# Patient Record
Sex: Male | Born: 1965 | Race: Black or African American | Hispanic: No | Marital: Single | Smoking: Never smoker
Health system: Southern US, Community
[De-identification: ages and names within clinical notes are randomized; demographics above are authoritative.]

## PROBLEM LIST (undated history)

## (undated) DIAGNOSIS — E119 Type 2 diabetes mellitus without complications: Secondary | ICD-10-CM

## (undated) DIAGNOSIS — I10 Essential (primary) hypertension: Secondary | ICD-10-CM

## (undated) HISTORY — DX: Type 2 diabetes mellitus without complications: E11.9

## (undated) HISTORY — PX: HERNIA REPAIR: SHX51

## (undated) HISTORY — DX: Essential (primary) hypertension: I10

---

## 2006-12-02 ENCOUNTER — Emergency Department (HOSPITAL_COMMUNITY): Admission: EM | Admit: 2006-12-02 | Discharge: 2006-12-02 | Payer: Self-pay | Admitting: Emergency Medicine

## 2017-02-26 ENCOUNTER — Emergency Department (HOSPITAL_COMMUNITY)
Admission: EM | Admit: 2017-02-26 | Discharge: 2017-02-26 | Disposition: A | Discharge status: Home / Self Care | Payer: Self-pay | Attending: Emergency Medicine | Admitting: Emergency Medicine

## 2017-02-26 ENCOUNTER — Encounter (HOSPITAL_COMMUNITY): Payer: Self-pay | Admitting: Emergency Medicine

## 2017-02-26 DIAGNOSIS — I10 Essential (primary) hypertension: Secondary | ICD-10-CM | POA: Insufficient documentation

## 2017-02-26 DIAGNOSIS — R739 Hyperglycemia, unspecified: Secondary | ICD-10-CM | POA: Insufficient documentation

## 2017-02-26 LAB — I-STAT CHEM 8, ED
BUN: 15 mg/dL (ref 6–20)
CALCIUM ION: 1.11 mmol/L — AB (ref 1.15–1.40)
Chloride: 107 mmol/L (ref 101–111)
Creatinine, Ser: 1 mg/dL (ref 0.61–1.24)
Glucose, Bld: 270 mg/dL — ABNORMAL HIGH (ref 65–99)
HCT: 41 % (ref 39.0–52.0)
Hemoglobin: 13.9 g/dL (ref 13.0–17.0)
Potassium: 3.5 mmol/L (ref 3.5–5.1)
SODIUM: 140 mmol/L (ref 135–145)
TCO2: 23 mmol/L (ref 22–32)

## 2017-02-26 LAB — COMPREHENSIVE METABOLIC PANEL
ALT: 28 U/L (ref 17–63)
AST: 20 U/L (ref 15–41)
Albumin: 3.5 g/dL (ref 3.5–5.0)
Alkaline Phosphatase: 96 U/L (ref 38–126)
Anion gap: 7 (ref 5–15)
BILIRUBIN TOTAL: 0.4 mg/dL (ref 0.3–1.2)
BUN: 22 mg/dL — AB (ref 6–20)
CALCIUM: 8.8 mg/dL — AB (ref 8.9–10.3)
CO2: 25 mmol/L (ref 22–32)
CREATININE: 1.46 mg/dL — AB (ref 0.61–1.24)
Chloride: 96 mmol/L — ABNORMAL LOW (ref 101–111)
GFR, EST NON AFRICAN AMERICAN: 54 mL/min — AB (ref >60)
Glucose, Bld: 774 mg/dL (ref 65–99)
Potassium: 4.3 mmol/L (ref 3.5–5.1)
Sodium: 128 mmol/L — ABNORMAL LOW (ref 135–145)
TOTAL PROTEIN: 6.6 g/dL (ref 6.5–8.1)

## 2017-02-26 LAB — URINALYSIS, ROUTINE W REFLEX MICROSCOPIC
Bacteria, UA: NONE SEEN
Bilirubin Urine: NEGATIVE
HGB URINE DIPSTICK: NEGATIVE
KETONES UR: NEGATIVE mg/dL
LEUKOCYTES UA: NEGATIVE
Nitrite: NEGATIVE
PH: 5 (ref 5.0–8.0)
Protein, ur: NEGATIVE mg/dL
RBC / HPF: NONE SEEN RBC/hpf (ref 0–5)
SQUAMOUS EPITHELIAL / LPF: NONE SEEN
Specific Gravity, Urine: 1.028 (ref 1.005–1.030)

## 2017-02-26 LAB — CBC WITH DIFFERENTIAL/PLATELET
BASOS ABS: 0 10*3/uL (ref 0.0–0.1)
BASOS PCT: 0 %
EOS ABS: 0.1 10*3/uL (ref 0.0–0.7)
EOS PCT: 2 %
HCT: 39.4 % (ref 39.0–52.0)
HEMOGLOBIN: 13.2 g/dL (ref 13.0–17.0)
Lymphocytes Relative: 22 %
Lymphs Abs: 1.4 10*3/uL (ref 0.7–4.0)
MCH: 28.6 pg (ref 26.0–34.0)
MCHC: 33.5 g/dL (ref 30.0–36.0)
MCV: 85.5 fL (ref 78.0–100.0)
Monocytes Absolute: 0.3 10*3/uL (ref 0.1–1.0)
Monocytes Relative: 4 %
NEUTROS PCT: 72 %
Neutro Abs: 4.5 10*3/uL (ref 1.7–7.7)
PLATELETS: 153 10*3/uL (ref 150–400)
RBC: 4.61 MIL/uL (ref 4.22–5.81)
RDW: 13.2 % (ref 11.5–15.5)
WBC: 6.3 10*3/uL (ref 4.0–10.5)

## 2017-02-26 LAB — CBG MONITORING, ED
GLUCOSE-CAPILLARY: 446 mg/dL — AB (ref 65–99)
GLUCOSE-CAPILLARY: 265 mg/dL — AB (ref 65–99)
Glucose-Capillary: >600 mg/dL (ref 65–99)
Glucose-Capillary: 493 mg/dL — ABNORMAL HIGH (ref 65–99)
Glucose-Capillary: 363 mg/dL — ABNORMAL HIGH (ref 65–99)

## 2017-02-26 LAB — BLOOD GAS, VENOUS
ACID-BASE DEFICIT: 2.2 mmol/L — AB (ref 0.0–2.0)
Bicarbonate: 22.3 mmol/L (ref 20.0–28.0)
FIO2: 0.21
O2 Content: 21 L/min
O2 SAT: 96.7 %
PCO2 VEN: 42.5 mmHg — AB (ref 44.0–60.0)
pH, Ven: 7.346 (ref 7.250–7.430)
pO2, Ven: 94.1 mmHg — ABNORMAL HIGH (ref 32.0–45.0)

## 2017-02-26 MED ORDER — SODIUM CHLORIDE 0.9 % IV BOLUS (SEPSIS)
2000.0000 mL | Freq: Once | INTRAVENOUS | Status: AC
  Administered 2017-02-26: 2000 mL via INTRAVENOUS

## 2017-02-26 MED ORDER — SODIUM CHLORIDE 0.9 % IV BOLUS (SEPSIS)
1000.0000 mL | Freq: Once | INTRAVENOUS | Status: AC
  Administered 2017-02-26: 1000 mL via INTRAVENOUS

## 2017-02-26 MED ORDER — DEXTROSE-NACL 5-0.45 % IV SOLN: INTRAVENOUS | Status: DC

## 2017-02-26 MED ORDER — SODIUM CHLORIDE 0.9 % IV SOLN: INTRAVENOUS | Status: DC

## 2017-02-26 MED ORDER — METFORMIN HCL 500 MG PO TABS: 500.0000 mg | ORAL_TABLET | Freq: Two times a day (BID) | ORAL | 0 refills | Status: DC

## 2017-02-26 MED ORDER — HYDROCHLOROTHIAZIDE 25 MG PO TABS: 25.0000 mg | ORAL_TABLET | Freq: Every day | ORAL | 0 refills | Status: DC

## 2017-02-26 MED ORDER — SODIUM CHLORIDE 0.9 % IV SOLN
INTRAVENOUS | Status: DC
  Administered 2017-02-26: 5.4 [IU]/h via INTRAVENOUS
  Filled 2017-02-26: qty 1

## 2017-02-26 NOTE — Discharge Instructions (Signed)
[facility] any of the numbers listed today or tomorrow to get a primary care physician to follow you for diabetes and high blood pressure.  Start taking the medications prescribed today.  You should be seen by her primary care physician in a week.  Ask your new doctor to recheck your blood pressure.

## 2017-02-26 NOTE — ED Triage Notes (Signed)
Pt c/o blurry vision and frequent urination x 1 month. CBG-HI >600.

## 2017-02-26 NOTE — ED Notes (Signed)
CRITICAL VALUE ALERT  Critical Value:  Glucose 774  Date & Time Notied:  02/26/17 @ 0921  Provider Notified: Dr. Gordy SaversJacubwitz  Orders Received/Actions taken: EDP made aware

## 2017-02-26 NOTE — Care Management Note (Signed)
Case Management Note  Patient Details  Name: Tim Higgins MRN: [id number] Date of Birth: 1966-03-17  Subjective/Objective:                 Patients glucose greater than 700, no pertinent past medical history.  Confirmed with patient that he does not have a PCP.  States he does work and his insurance is to start in January.   Gave patient a list of clinics locally that are accepting new patients and [facility].  Patient appeared interested in info given and asked appropriate questions concerning importance of followup with PCP with his high blood sugars.   Action/Plan:   Expected Discharge Date:                  Expected Discharge Plan:     In-House Referral:     Discharge planning Services     Post Acute Care Choice:    Choice offered to:     DME Arranged:    DME Agency:     HH Arranged:    HH Agency:     Status of Service:     If discussed at [employer] Length of Stay Meetings, dates discussed:    Additional Comments:  Vangie BickerBrown, Clearnce Leja Jane, RN 02/26/2017, 10:18 AM

## 2017-02-26 NOTE — ED Provider Notes (Addendum)
[facility]ENCY DEPARTMENT Provider Note   CSN: [id number] Arrival date & time: 02/26/17  [id number]     History   Chief Complaint Chief Complaint  Patient presents with  . Hyperglycemia    HPI Zannie V Alwyn RenHopper is a 51 y.o. male.  Complains of frequent urination, constant thirst and blurred vision for 1 month.  Symptoms unchanged.  He discussed these issues with people at church who told him to come to the hospital to get checked out.  He denies any chest pain denies fever.  No other associated symptoms.  No treatment prior to coming here.  Nothing makes symptoms better or worse  HPI  History reviewed. No pertinent past medical history.  There are no active problems to display for this patient.  Past medical history negative Past Surgical History:  Procedure Laterality Date  . HERNIA REPAIR         Home Medications    Prior to Admission medications   Not on File    Family History No family history on file.  Social History Social History   Tobacco Use  . Smoking status: Never Smoker  . Smokeless tobacco: Never Used  Substance Use Topics  . Alcohol use: No    Frequency: Never  . Drug use: No     Allergies   Patient has no known allergies.   Review of Systems Review of Systems  Constitutional: Negative.   HENT: Negative.   Eyes: Positive for visual disturbance.  Respiratory: Negative.   Cardiovascular: Negative.   Gastrointestinal: Negative.   Endocrine: Positive for polydipsia and polyuria.  Musculoskeletal: Negative.   Skin: Negative.   Neurological: Negative.   Psychiatric/Behavioral: Negative.   All other systems reviewed and are negative.    Physical Exam Updated Vital Signs BP (!) 186/123   Pulse 98   Temp (!) 97.5 F (36.4 C) (Oral)   Resp 18   SpO2 97%   Physical Exam  Constitutional: He is oriented to person, place, and time. He appears well-developed and well-nourished. No distress.  HENT:  Head: Normocephalic and  atraumatic.  Mucous membranes dry  Eyes: Conjunctivae are normal. Pupils are equal, round, and reactive to light.  Neck: Neck supple. No tracheal deviation present. No thyromegaly present.  Cardiovascular: Normal rate and regular rhythm.  No murmur heard. Pulmonary/Chest: Effort normal and breath sounds normal.  Abdominal: Soft. Bowel sounds are normal. He exhibits no distension. There is no tenderness.  Genitourinary: Penis normal.  Musculoskeletal: Normal range of motion. He exhibits no edema or tenderness.  Neurological: He is alert and oriented to person, place, and time. Coordination normal.  Skin: Skin is warm and dry. Capillary refill takes less than 2 seconds. No rash noted.  Psychiatric: He has a normal mood and affect.  Nursing note and vitals reviewed.    ED Treatments / Results  Labs (all labs ordered are listed, but only abnormal results are displayed) Labs Reviewed  CBG MONITORING, ED - Abnormal; Notable for the following components:      Result Value   Glucose-Capillary >600 (*)    All other components within normal limits    EKG  EKG Interpretation None       Radiology No results found.  Procedures Procedures (including critical care time)  Medications Ordered in ED Medications - No data to display Results for orders placed or performed during the hospital encounter of 02/26/17  Comprehensive metabolic panel  Result Value Ref Range   Sodium 128 (L) 135 - 145  mmol/L   Potassium 4.3 3.5 - 5.1 mmol/L   Chloride 96 (L) 101 - 111 mmol/L   CO2 25 22 - 32 mmol/L   Glucose, Bld 774 (HH) 65 - 99 mg/dL   BUN 22 (H) 6 - 20 mg/dL   Creatinine, Ser 1.61 (H) 0.61 - 1.24 mg/dL   Calcium 8.8 (L) 8.9 - 10.3 mg/dL   Total Protein 6.6 6.5 - 8.1 g/dL   Albumin 3.5 3.5 - 5.0 g/dL   AST 20 15 - 41 U/L   ALT 28 17 - 63 U/L   Alkaline Phosphatase 96 38 - 126 U/L   Total Bilirubin 0.4 0.3 - 1.2 mg/dL   GFR calc non Af Amer 54 (L) >60 mL/min   GFR calc Af Amer >60  >60 mL/min   Anion gap 7 5 - 15  CBC with Differential/Platelet  Result Value Ref Range   WBC 6.3 4.0 - 10.5 K/uL   RBC 4.61 4.22 - 5.81 MIL/uL   Hemoglobin 13.2 13.0 - 17.0 g/dL   HCT 09.6 04.5 - 40.9 %   MCV 85.5 78.0 - 100.0 fL   MCH 28.6 26.0 - 34.0 pg   MCHC 33.5 30.0 - 36.0 g/dL   RDW 81.1 91.4 - 78.2 %   Platelets 153 150 - 400 K/uL   Neutrophils Relative % 72 %   Neutro Abs 4.5 1.7 - 7.7 K/uL   Lymphocytes Relative 22 %   Lymphs Abs 1.4 0.7 - 4.0 K/uL   Monocytes Relative 4 %   Monocytes Absolute 0.3 0.1 - 1.0 K/uL   Eosinophils Relative 2 %   Eosinophils Absolute 0.1 0.0 - 0.7 K/uL   Basophils Relative 0 %   Basophils Absolute 0.0 0.0 - 0.1 K/uL  Blood gas, venous  Result Value Ref Range   FIO2 0.21    O2 Content 21.0 L/min   Delivery systems ROOM AIR    pH, Ven 7.346 7.250 - 7.430   pCO2, Ven 42.5 (L) 44.0 - 60.0 mmHg   pO2, Ven 94.1 (H) 32.0 - 45.0 mmHg   Bicarbonate 22.3 20.0 - 28.0 mmol/L   Acid-base deficit 2.2 (H) 0.0 - 2.0 mmol/L   O2 Saturation 96.7 %   Collection site NOT INDICATED    Drawn by DRAWN BY RN    Sample type VENOUS   Urinalysis, Routine w reflex microscopic  Result Value Ref Range   Color, Urine COLORLESS (A) YELLOW   APPearance CLEAR CLEAR   Specific Gravity, Urine 1.028 1.005 - 1.030   pH 5.0 5.0 - 8.0   Glucose, UA >=500 (A) NEGATIVE mg/dL   Hgb urine dipstick NEGATIVE NEGATIVE   Bilirubin Urine NEGATIVE NEGATIVE   Ketones, ur NEGATIVE NEGATIVE mg/dL   Protein, ur NEGATIVE NEGATIVE mg/dL   Nitrite NEGATIVE NEGATIVE   Leukocytes, UA NEGATIVE NEGATIVE   RBC / HPF NONE SEEN 0 - 5 RBC/hpf   WBC, UA 0-5 0 - 5 WBC/hpf   Bacteria, UA NONE SEEN NONE SEEN   Squamous Epithelial / LPF NONE SEEN NONE SEEN  CBG monitoring, ED  Result Value Ref Range   Glucose-Capillary >600 (HH) 65 - 99 mg/dL  CBG monitoring, ED  Result Value Ref Range   Glucose-Capillary >600 (HH) 65 - 99 mg/dL  CBG monitoring, ED  Result Value Ref Range    Glucose-Capillary 493 (H) 65 - 99 mg/dL   Comment 1 Document in Chart   CBG monitoring, ED  Result Value Ref Range   Glucose-Capillary 446 (  H) 65 - 99 mg/dL  CBG monitoring, ED  Result Value Ref Range   Glucose-Capillary 363 (H) 65 - 99 mg/dL  CBG monitoring, ED  Result Value Ref Range   Glucose-Capillary 265 (H) 65 - 99 mg/dL  I-stat chem 8, ed  Result Value Ref Range   Sodium 140 135 - 145 mmol/L   Potassium 3.5 3.5 - 5.1 mmol/L   Chloride 107 101 - 111 mmol/L   BUN 15 6 - 20 mg/dL   Creatinine, Ser 4.091.00 0.61 - 1.24 mg/dL   Glucose, Bld 811270 (H) 65 - 99 mg/dL   Calcium, Ion 9.141.11 (L) 1.15 - 1.40 mmol/L   TCO2 23 22 - 32 mmol/L   Hemoglobin 13.9 13.0 - 17.0 g/dL   HCT 78.241.0 95.639.0 - 21.352.0 %   No results found.  Initial Impression / Assessment and Plan / ED Course  I have reviewed the triage vital signs and the nursing notes.  Pertinent labs & imaging results that were available during my care of the patient were reviewed by me and considered in my medical decision making (see chart for details).     3:10 PM patient feels much improved after treatment with intravenous hydration, intravenous insulin drip.  Is ready to go home. I suspect the patient has long-standing unrecognized type 2 diabetes hypertension Plan prescriptions metformin, HCTZ and referral primary care, blood pressure recheck 1 week Final Clinical Impressions(s) / ED Diagnoses   #1 hyperglycemia #2 elevated blood pressure Final diagnoses:  None    ED Discharge Orders    None       Doug SouJacubowitz, Chasten Blaze, MD 02/26/17 1516    Doug SouJacubowitz, Eris Hannan, MD 02/26/17 1521

## 2019-05-16 ENCOUNTER — Emergency Department (HOSPITAL_COMMUNITY): Payer: Self-pay

## 2019-05-16 ENCOUNTER — Other Ambulatory Visit: Payer: Self-pay

## 2019-05-16 ENCOUNTER — Emergency Department (HOSPITAL_COMMUNITY)
Admission: EM | Admit: 2019-05-16 | Discharge: 2019-05-17 | Disposition: A | Discharge status: Home / Self Care | Payer: Self-pay | Attending: Emergency Medicine | Admitting: Emergency Medicine

## 2019-05-16 ENCOUNTER — Encounter (HOSPITAL_COMMUNITY): Payer: Self-pay | Admitting: *Deleted

## 2019-05-16 DIAGNOSIS — I10 Essential (primary) hypertension: Secondary | ICD-10-CM

## 2019-05-16 DIAGNOSIS — M25511 Pain in right shoulder: Secondary | ICD-10-CM

## 2019-05-16 DIAGNOSIS — Z79899 Other long term (current) drug therapy: Secondary | ICD-10-CM | POA: Insufficient documentation

## 2019-05-16 DIAGNOSIS — R030 Elevated blood-pressure reading, without diagnosis of hypertension: Secondary | ICD-10-CM | POA: Insufficient documentation

## 2019-05-16 DIAGNOSIS — R739 Hyperglycemia, unspecified: Secondary | ICD-10-CM

## 2019-05-16 DIAGNOSIS — Z7984 Long term (current) use of oral hypoglycemic drugs: Secondary | ICD-10-CM | POA: Insufficient documentation

## 2019-05-16 LAB — CBG MONITORING, ED: Glucose-Capillary: 500 mg/dL — ABNORMAL HIGH (ref 70–99)

## 2019-05-16 NOTE — ED Notes (Signed)
Pt states he took two pain relievers from OTC at [facility] around 1900 tonight without relief.

## 2019-05-16 NOTE — ED Notes (Signed)
Pt to XRAY

## 2019-05-16 NOTE — ED Triage Notes (Signed)
Pt c/o right shoulder pain for the past two weeks, denies any  Injury,

## 2019-05-16 NOTE — ED Provider Notes (Signed)
[facility] EMERGENCY DEPARTMENT Provider Note   CSN: [id number] Arrival date & time: 05/16/19  2213   History Chief Complaint  Patient presents with  . Shoulder Pain    Tim Higgins is a 54 y.o. male.  The history is provided by the patient.  Shoulder Pain He complains of pain in the posterior aspect of his right shoulder over the last 2 weeks.  Pain is worse with movement and if he pushes on that area.  He rates his pain at 10/10.  He took an unknown over-the-counter pain medicine without relief.  He denies any trauma or unusual activity.  He denies prior shoulder problems.  Also, he had been given a prescription for medicine for his blood sugar and blood pressure the last time he was in the emergency department about 2 years ago and he took the medication for 1 month but has not been able to get it refilled.  He has not checked his blood pressure or blood sugar since then.   History reviewed. No pertinent past medical history.  There are no problems to display for this patient.   Past Surgical History:  Procedure Laterality Date  . HERNIA REPAIR         No family history on file.  Social History   Tobacco Use  . Smoking status: Never Smoker  . Smokeless tobacco: Never Used  Substance Use Topics  . Alcohol use: No  . Drug use: No    Home Medications Prior to Admission medications   Medication Sig Start Date End Date Taking? Authorizing Provider  hydrochlorothiazide (HYDRODIURIL) 25 MG tablet Take 1 tablet (25 mg total) daily by mouth. 02/26/17   Doug Sou, MD  metFORMIN (GLUCOPHAGE) 500 MG tablet Take 1 tablet (500 mg total) 2 (two) times daily with a meal by mouth. 02/26/17   Doug Sou, MD    Allergies    Patient has no known allergies.  Review of Systems   Review of Systems  All other systems reviewed and are negative.   Physical Exam Updated Vital Signs BP (!) 198/113 (BP Location: Left Arm)   Pulse 88   Temp (!) 97.4 F (36.3 C)  (Oral)   Resp 16   Ht 6\' 1"  (1.854 m)   Wt 136.1 kg   SpO2 100%   BMI 39.58 kg/m   Physical Exam Vitals and nursing note reviewed.   54 year old male, resting comfortably and in no acute distress. Vital signs are significant for elevated blood pressure. Oxygen saturation is 100%, which is normal. Head is normocephalic and atraumatic. PERRLA, EOMI. Oropharynx is clear. Neck is nontender and supple without adenopathy or JVD. Back is nontender and there is no CVA tenderness. Lungs are clear without rales, wheezes, or rhonchi. Chest is nontender. Heart has regular rate and rhythm without murmur. Abdomen is soft, flat, nontender without masses or hepatosplenomegaly and peristalsis is normoactive. Extremities:No swelling or deformity. There is tenderness to palpation of the right anterior deltoid groove, no other area of tenderness identified.  There is moderate restricted range of motion of the right shoulder in all 3 planes.  Rotator cuff impingement signs are present.Skin is warm and dry without rash. Neurologic: Mental status is normal, cranial nerves are intact, there are no motor or sensory deficits.  ED Results / Procedures / Treatments   Labs (all labs ordered are listed, but only abnormal results are displayed) Labs Reviewed  BASIC METABOLIC PANEL - Abnormal; Notable for the following components:  Result Value   Sodium 131 (*)    Chloride 97 (*)    Glucose, Bld 519 (*)    All other components within normal limits  CBG MONITORING, ED - Abnormal; Notable for the following components:   Glucose-Capillary 500 (*)    All other components within normal limits  CBG MONITORING, ED - Abnormal; Notable for the following components:   Glucose-Capillary 297 (*)    All other components within normal limits  CBC WITH DIFFERENTIAL/PLATELET   Radiology DG Shoulder Right  Result Date: 05/16/2019 CLINICAL DATA:  Right shoulder pain for 2 weeks. No known injury. EXAM: RIGHT SHOULDER -  2+ VIEW COMPARISON:  None. FINDINGS: There is no evidence of fracture or dislocation. Mild glenohumeral osteoarthritis with subchondral cystic change. Trace acromioclavicular osteoarthritis tiny inferiorly directed spurs. Soft tissues are unremarkable. IMPRESSION: Mild glenohumeral and acromioclavicular osteoarthritis. Electronically Signed   By: Keith Rake M.D.   On: 05/16/2019 23:11   Procedures Procedures   Medications Ordered in ED Medications  sodium chloride 0.9 % bolus 1,000 mL (0 mLs Intravenous Stopped 05/17/19 0135)  metFORMIN (GLUCOPHAGE) tablet 500 mg (500 mg Oral Given 05/17/19 0014)  insulin aspart (novoLOG) injection 10 Units (10 Units Intravenous Given 05/17/19 0015)  ketorolac (TORADOL) 30 MG/ML injection 30 mg (30 mg Intravenous Given 05/17/19 0135)    ED Course  I have reviewed the triage vital signs and the nursing notes.  Pertinent labs & imaging results that were available during my care of the patient were reviewed by me and considered in my medical decision making (see chart for details).  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  Shoulder x-rays show mild degenerative changes.  Markedly elevated blood pressure.  Old records are reviewed, and he was seen February 26, 2017 for elevated glucose and also noted to have significantly elevated blood pressure at that time.  Initial glucose was 774 with creatinine of 1.46, repeat had glucose down to 270 and creatinine down to 1.0.  Given the fact that he has gone over 2 years without medications, I feel it is important to recheck his glucose and creatinine.  Glucose is 500 but with normal renal function.  Glucose has come down to 297 with fluids and insulin.  His renal function is normal, it is reasonable to start NSAIDs and is given a dose of ketorolac and he is advised to take over-the-counter naproxen.  He is discharged with  prescriptions for Metformin and hydrochlorothiazide and he is given financial resources information to try to get established with a primary care office.  Given his degree of blood pressure elevation, I suspect he will need to be on additional medication.  Importance of not allowing his medication to run out was stressed.  Final Clinical Impression(s) / ED Diagnoses Final diagnoses:  Right shoulder pain, unspecified chronicity  Elevated blood pressure reading with diagnosis of hypertension  Hyperglycemia    Rx / DC Orders ED Discharge Orders         Ordered    hydrochlorothiazide (HYDRODIURIL) 25 MG tablet  Daily     05/17/19 0159    metFORMIN (GLUCOPHAGE) 500 MG tablet  2 times daily with meals     05/17/19 8127           Delora Fuel, MD 51/70/01 0206

## 2019-05-17 LAB — CBC WITH DIFFERENTIAL/PLATELET
Abs Immature Granulocytes: 0.02 10*3/uL (ref 0.00–0.07)
Basophils Absolute: 0 10*3/uL (ref 0.0–0.1)
Basophils Relative: 0 %
Eosinophils Absolute: 0.2 10*3/uL (ref 0.0–0.5)
Eosinophils Relative: 2 %
HCT: 41.5 % (ref 39.0–52.0)
Hemoglobin: 13.5 g/dL (ref 13.0–17.0)
Immature Granulocytes: 0 %
Lymphocytes Relative: 23 %
Lymphs Abs: 1.6 10*3/uL (ref 0.7–4.0)
MCH: 27.6 pg (ref 26.0–34.0)
MCHC: 32.5 g/dL (ref 30.0–36.0)
MCV: 84.9 fL (ref 80.0–100.0)
Monocytes Absolute: 0.5 10*3/uL (ref 0.1–1.0)
Monocytes Relative: 7 %
Neutro Abs: 4.5 10*3/uL (ref 1.7–7.7)
Neutrophils Relative %: 68 %
Platelets: 194 10*3/uL (ref 150–400)
RBC: 4.89 MIL/uL (ref 4.22–5.81)
RDW: 13 % (ref 11.5–15.5)
WBC: 6.8 10*3/uL (ref 4.0–10.5)
nRBC: 0 % (ref 0.0–0.2)

## 2019-05-17 LAB — BASIC METABOLIC PANEL
Anion gap: 10 (ref 5–15)
BUN: 19 mg/dL (ref 6–20)
CO2: 24 mmol/L (ref 22–32)
Calcium: 9 mg/dL (ref 8.9–10.3)
Chloride: 97 mmol/L — ABNORMAL LOW (ref 98–111)
Creatinine, Ser: 1.03 mg/dL (ref 0.61–1.24)
GFR calc Af Amer: >60 mL/min (ref >60)
GFR calc non Af Amer: >60 mL/min (ref >60)
Glucose, Bld: 519 mg/dL (ref 70–99)
Potassium: 4.2 mmol/L (ref 3.5–5.1)
Sodium: 131 mmol/L — ABNORMAL LOW (ref 135–145)

## 2019-05-17 LAB — CBG MONITORING, ED: Glucose-Capillary: 297 mg/dL — ABNORMAL HIGH (ref 70–99)

## 2019-05-17 MED ORDER — INSULIN ASPART 100 UNIT/ML IV SOLN
10.0000 [IU] | Freq: Once | INTRAVENOUS | Status: AC
  Administered 2019-05-17: 10 [IU] via INTRAVENOUS

## 2019-05-17 MED ORDER — HYDROCHLOROTHIAZIDE 25 MG PO TABS: 25.0000 mg | ORAL_TABLET | Freq: Every day | ORAL | 0 refills | Status: DC

## 2019-05-17 MED ORDER — KETOROLAC TROMETHAMINE 30 MG/ML IJ SOLN
30.0000 mg | Freq: Once | INTRAMUSCULAR | Status: AC
  Administered 2019-05-17: 02:00:00 30 mg via INTRAVENOUS
  Filled 2019-05-17: qty 1

## 2019-05-17 MED ORDER — SODIUM CHLORIDE 0.9 % IV BOLUS
1000.0000 mL | Freq: Once | INTRAVENOUS | Status: AC
  Administered 2019-05-17: 1000 mL via INTRAVENOUS

## 2019-05-17 MED ORDER — METFORMIN HCL 500 MG PO TABS
500.0000 mg | ORAL_TABLET | Freq: Once | ORAL | Status: AC
Start: 2019-05-17 — End: 2019-05-17
  Administered 2019-05-17: 500 mg via ORAL
  Filled 2019-05-17: qty 1

## 2019-05-17 MED ORDER — METFORMIN HCL 500 MG PO TABS: 500.0000 mg | ORAL_TABLET | Freq: Two times a day (BID) | ORAL | 0 refills | Status: DC

## 2019-05-17 NOTE — Discharge Instructions (Addendum)
Your shoulder pain seems to be a combination of rotator cuff injury and frozen shoulder.  Please follow-up with the orthopedic doctor who may be able to schedule some physical therapy to help your shoulder.  In the meantime, apply ice several times a day.  Try to put your shoulder through is full range of motion as he can.  Take 2 naproxen tablets at a time twice a day.  Your blood pressure was very high today.  You will need to continuously be on blood pressure medication.  You are again being given a 1 month supply today.  During that time, you will need to make arrangements to get a primary care physician who can prescribe your medication on an ongoing basis.  Do not allow yourself to run out of the medication.  If your blood pressure is not adequately controlled, it can lead to heart attacks, strokes, kidney failure.  Please monitor your blood pressure at home.  The top number should be no higher than 140, bottom number no higher than 90.  Your blood sugar is elevated indicating that you have diabetes.  You are being given a 1 month supply of metformin.  It is important that you continue to take this even when the current prescription bottle runs out.  Failure to treat diabetes adequately leads to heart attacks, strokes, kidney failure, blindness, amputations.

## 2019-05-17 NOTE — ED Notes (Signed)
Pt ambulated to restroom. 

## 2019-05-17 NOTE — ED Notes (Signed)
Date and time results received: 05/17/19 0028   Test: Glucose Critical Value: 519  Name of Provider Notified: Preston Fleeting, MD

## 2019-05-30 ENCOUNTER — Emergency Department (HOSPITAL_COMMUNITY): Admission: EM | Admit: 2019-05-30 | Discharge: 2019-05-30 | Discharge status: Absent without leave | Payer: Self-pay

## 2019-05-30 ENCOUNTER — Other Ambulatory Visit: Payer: Self-pay

## 2019-05-30 ENCOUNTER — Ambulatory Visit (INDEPENDENT_AMBULATORY_CARE_PROVIDER_SITE_OTHER): Payer: Self-pay | Admitting: Family Medicine

## 2019-05-30 ENCOUNTER — Encounter: Payer: Self-pay | Admitting: Family Medicine

## 2019-05-30 VITALS — BP 168/102 | HR 87 | Temp 99.0°F | Resp 20 | Ht 73.0 in | Wt 269.0 lb

## 2019-05-30 DIAGNOSIS — I152 Hypertension secondary to endocrine disorders: Secondary | ICD-10-CM | POA: Insufficient documentation

## 2019-05-30 DIAGNOSIS — I10 Essential (primary) hypertension: Secondary | ICD-10-CM

## 2019-05-30 DIAGNOSIS — E1165 Type 2 diabetes mellitus with hyperglycemia: Secondary | ICD-10-CM

## 2019-05-30 DIAGNOSIS — E1159 Type 2 diabetes mellitus with other circulatory complications: Secondary | ICD-10-CM

## 2019-05-30 DIAGNOSIS — E1169 Type 2 diabetes mellitus with other specified complication: Secondary | ICD-10-CM | POA: Insufficient documentation

## 2019-05-30 DIAGNOSIS — N521 Erectile dysfunction due to diseases classified elsewhere: Secondary | ICD-10-CM

## 2019-05-30 LAB — BAYER DCA HB A1C WAIVED: HB A1C (BAYER DCA - WAIVED): >14 % — ABNORMAL HIGH (ref <7.0)

## 2019-05-30 MED ORDER — LOSARTAN POTASSIUM 25 MG PO TABS: 25.0000 mg | ORAL_TABLET | Freq: Every day | ORAL | 3 refills | Status: DC

## 2019-05-30 MED ORDER — HYDROCHLOROTHIAZIDE 25 MG PO TABS: 25.0000 mg | ORAL_TABLET | Freq: Every day | ORAL | 5 refills | Status: DC

## 2019-05-30 MED ORDER — METFORMIN HCL 1000 MG PO TABS: 1000.0000 mg | ORAL_TABLET | Freq: Two times a day (BID) | ORAL | 3 refills | Status: DC

## 2019-05-30 NOTE — Patient Instructions (Signed)
DASH Eating Plan DASH stands for "Dietary Approaches to Stop Hypertension." The DASH eating plan is a healthy eating plan that has been shown to reduce high blood pressure (hypertension). Additional health benefits may include reducing the risk of type 2 diabetes mellitus, heart disease, and stroke. The DASH eating plan may also help with weight loss.  WHAT DO I NEED TO KNOW ABOUT THE DASH EATING PLAN? For the DASH eating plan, you will follow these general guidelines:  Choose foods with a percent daily value for sodium of less than 5% (as listed on the food label).  Use salt-free seasonings or herbs instead of table salt or sea salt.  Check with your health care provider or pharmacist before using salt substitutes.  Eat lower-sodium products, often labeled as "lower sodium" or "no salt added."  Eat fresh foods.  Eat more vegetables, fruits, and low-fat dairy products.  Choose whole grains. Look for the word "whole" as the first word in the ingredient list.  Choose fish and skinless chicken or turkey more often than red meat. Limit fish, poultry, and meat to 6 oz (170 g) each day.  Limit sweets, desserts, sugars, and sugary drinks.  Choose heart-healthy fats.  Limit cheese to 1 oz (28 g) per day.  Eat more home-cooked food and less restaurant, buffet, and fast food.  Limit fried foods.  Cook foods using methods other than frying.  Limit canned vegetables. If you do use them, rinse them well to decrease the sodium.  When eating at a restaurant, ask that your food be prepared with less salt, or no salt if possible.  WHAT FOODS CAN I EAT? Seek help from a dietitian for individual calorie needs.  Grains Whole grain or whole wheat bread. Brown rice. Whole grain or whole wheat pasta. Quinoa, bulgur, and whole grain cereals. Low-sodium cereals. Corn or whole wheat flour tortillas. Whole grain cornbread. Whole grain crackers. Low-sodium crackers.  Vegetables Fresh or frozen  vegetables (raw, steamed, roasted, or grilled). Low-sodium or reduced-sodium tomato and vegetable juices. Low-sodium or reduced-sodium tomato sauce and paste. Low-sodium or reduced-sodium canned vegetables.   Fruits All fresh, canned (in natural juice), or frozen fruits.  Meat and Other Protein Products Ground beef (85% or leaner), grass-fed beef, or beef trimmed of fat. Skinless chicken or turkey. Ground chicken or turkey. Pork trimmed of fat. All fish and seafood. Eggs. Dried beans, peas, or lentils. Unsalted nuts and seeds. Unsalted canned beans.  Dairy Low-fat dairy products, such as skim or 1% milk, 2% or reduced-fat cheeses, low-fat ricotta or cottage cheese, or plain low-fat yogurt. Low-sodium or reduced-sodium cheeses.  Fats and Oils Tub margarines without trans fats. Light or reduced-fat mayonnaise and salad dressings (reduced sodium). Avocado. Safflower, olive, or canola oils. Natural peanut or almond butter.  Other Unsalted popcorn and pretzels. The items listed above may not be a complete list of recommended foods or beverages. Contact your dietitian for more options.  WHAT FOODS ARE NOT RECOMMENDED?  Grains White bread. White pasta. White rice. Refined cornbread. Bagels and croissants. Crackers that contain trans fat.  Vegetables Creamed or fried vegetables. Vegetables in a cheese sauce. Regular canned vegetables. Regular canned tomato sauce and paste. Regular tomato and vegetable juices.  Fruits Dried fruits. Canned fruit in light or heavy syrup. Fruit juice.  Meat and Other Protein Products Fatty cuts of meat. Ribs, chicken wings, bacon, sausage, bologna, salami, chitterlings, fatback, hot dogs, bratwurst, and packaged luncheon meats. Salted nuts and seeds. Canned beans with salt.    Dairy Whole or 2% milk, cream, half-and-half, and cream cheese. Whole-fat or sweetened yogurt. Full-fat cheeses or blue cheese. Nondairy creamers and whipped toppings. Processed cheese,  cheese spreads, or cheese curds.  Condiments Onion and garlic salt, seasoned salt, table salt, and sea salt. Canned and packaged gravies. Worcestershire sauce. Tartar sauce. Barbecue sauce. Teriyaki sauce. Soy sauce, including reduced sodium. Steak sauce. Fish sauce. Oyster sauce. Cocktail sauce. Horseradish. Ketchup and mustard. Meat flavorings and tenderizers. Bouillon cubes. Hot sauce. Tabasco sauce. Marinades. Taco seasonings. Relishes.  Fats and Oils Butter, stick margarine, lard, shortening, ghee, and bacon fat. Coconut, palm kernel, or palm oils. Regular salad dressings.  Other Pickles and olives. Salted popcorn and pretzels.  The items listed above may not be a complete list of foods and beverages to avoid. Contact your dietitian for more information.  WHERE CAN I FIND MORE INFORMATION? National Heart, Lung, and Blood Institute: [employer]Prom[employer]it Document Released: 03/23/2011 Document Revised: 08/18/2013 Document Reviewed: 02/05/2013 [facility] Patient Information 2015 [place], [place]. This information is not intended to replace advice given to you by your health care provider. Make sure you discuss any questions you have with your health care provider.   I think that you would greatly benefit from seeing a nutritionist.  If you are interested, please call Dr Gerilyn Pilgrim at [phone] to schedule an appointment.  Continue to monitor your blood sugars as we discussed and record them. Bring the log to your next appointment.  Take your medications as directed.    Goal Blood glucose:    Fasting (before meals) = 80 to 130   Within 2 hours of eating = less than 180   Understanding your Hemoglobin A1c:     Diabetes Mellitus and Nutrition    I think that you would greatly benefit from seeing a nutritionist. If this is something you are interested in, please call Dr Gerilyn Pilgrim at [phone] to schedule an appointment.   When you have diabetes (diabetes  mellitus), it is very important to have healthy eating habits because your blood sugar (glucose) levels are greatly affected by what you eat and drink. Eating healthy foods in the appropriate amounts, at about the same times every day, can help you:  Control your blood glucose.  Lower your risk of heart disease.  Improve your blood pressure.  Reach or maintain a healthy weight.  Every person with diabetes is different, and each person has different needs for a meal plan. Your health care provider may recommend that you work with a diet and nutrition specialist (dietitian) to make a meal plan that is best for you. Your meal plan may vary depending on factors such as:  The calories you need.  The medicines you take.  Your weight.  Your blood glucose, blood pressure, and cholesterol levels.  Your activity level.  Other health conditions you have, such as heart or kidney disease.  How do carbohydrates affect me? Carbohydrates affect your blood glucose level more than any other type of food. Eating carbohydrates naturally increases the amount of glucose in your blood. Carbohydrate counting is a method for keeping track of how many carbohydrates you eat. Counting carbohydrates is important to keep your blood glucose at a healthy level, especially if you use insulin or take certain oral diabetes medicines. It is important to know how many carbohydrates you can safely have in each meal. This is different for every person. Your dietitian can help you calculate how many carbohydrates you should have at each meal and for snack. Foods  that contain carbohydrates include:  Bread, cereal, rice, pasta, and crackers.  Potatoes and corn.  Peas, beans, and lentils.  Milk and yogurt.  Fruit and juice.  Desserts, such as cakes, cookies, ice cream, and candy.  How does alcohol affect me? Alcohol can cause a sudden decrease in blood glucose (hypoglycemia), especially if you use insulin or take  certain oral diabetes medicines. Hypoglycemia can be a life-threatening condition. Symptoms of hypoglycemia (sleepiness, dizziness, and confusion) are similar to symptoms of having too much alcohol. If your health care provider says that alcohol is safe for you, follow these guidelines:  Limit alcohol intake to no more than 1 drink per day for nonpregnant women and 2 drinks per day for men. One drink equals 12 oz of beer, 5 oz of wine, or 1 oz of hard liquor.  Do not drink on an empty stomach.  Keep yourself hydrated with water, diet soda, or unsweetened iced tea.  Keep in mind that regular soda, juice, and other mixers may contain a lot of sugar and must be counted as carbohydrates.  What are tips for following this plan?  Reading food labels  Start by checking the serving size on the label. The amount of calories, carbohydrates, fats, and other nutrients listed on the label are based on one serving of the food. Many foods contain more than one serving per package.  Check the total grams (g) of carbohydrates in one serving. You can calculate the number of servings of carbohydrates in one serving by dividing the total carbohydrates by 15. For example, if a food has 30 g of total carbohydrates, it would be equal to 2 servings of carbohydrates.  Check the number of grams (g) of saturated and trans fats in one serving. Choose foods that have low or no amount of these fats.  Check the number of milligrams (mg) of sodium in one serving. Most people should limit total sodium intake to less than 2,300 mg per day.  Always check the nutrition information of foods labeled as "low-fat" or "nonfat". These foods may be higher in added sugar or refined carbohydrates and should be avoided.  Talk to your dietitian to identify your daily goals for nutrients listed on the label.  Shopping  Avoid buying canned, premade, or processed foods. These foods tend to be high in fat, sodium, and added sugar.   Shop around the outside edge of the grocery store. This includes fresh fruits and vegetables, bulk grains, fresh meats, and fresh dairy.  Cooking  Use low-heat cooking methods, such as baking, instead of high-heat cooking methods like deep frying.  Cook using healthy oils, such as olive, canola, or sunflower oil.  Avoid cooking with butter, cream, or high-fat meats.  Meal planning  Eat meals and snacks regularly, preferably at the same times every day. Avoid going long periods of time without eating.  Eat foods high in fiber, such as fresh fruits, vegetables, beans, and whole grains. Talk to your dietitian about how many servings of carbohydrates you can eat at each meal.  Eat 4-6 ounces of lean protein each day, such as lean meat, chicken, fish, eggs, or tofu. 1 ounce is equal to 1 ounce of meat, chicken, or fish, 1 egg, or 1/4 cup of tofu.  Eat some foods each day that contain healthy fats, such as avocado, nuts, seeds, and fish.  Lifestyle   Check your blood glucose regularly.  Exercise at least 30 minutes 5 or more days each week, or as  told by your health care provider.  Take medicines as told by your health care provider.  Do not use any products that contain nicotine or tobacco, such as cigarettes and e-cigarettes. If you need help quitting, ask your health care provider.  Work with a Veterinary surgeon or diabetes educator to identify strategies to manage stress and any emotional and social challenges.   What are some questions to ask my health care provider?  Do I need to meet with a diabetes educator?  Do I need to meet with a dietitian?  What number can I call if I have questions?  When are the best times to check my blood glucose?   Where to find more information:  American Diabetes Association: diabetes.org/food-and-fitness/food  Academy of Nutrition and Dietetics: https://www.vargas.com/  [employer] of Diabetes  and Digestive and Kidney Diseases (NIH): FindJewelers.cz   Summary  A healthy meal plan will help you control your blood glucose and maintain a healthy lifestyle.  Working with a diet and nutrition specialist (dietitian) can help you make a meal plan that is best for you.  Keep in mind that carbohydrates and alcohol have immediate effects on your blood glucose levels. It is important to count carbohydrates and to use alcohol carefully. This information is not intended to replace advice given to you by your health care provider. Make sure you discuss any questions you have with your health care provider. Document Released: 12/29/2004 Document Revised: 05/08/2016 Document Reviewed: 05/08/2016 [employer]vier Interactive Patient Education  [employer].

## 2019-05-30 NOTE — Progress Notes (Signed)
Subjective:  Patient ID: Tim Higgins, male    DOB: 04/06/1966, 54 y.o.   MRN: [id number]  Patient Care Team: Baruch Gouty, FNP as PCP - General (Family Medicine)   Chief Complaint:  Establish Care (New)   HPI: Tim INTRIAGO is a 54 y.o. male presenting on 05/30/2019 for Establish Care (New)  Pt presents today to establish care with new PCP. Pt states he has not been seen by a PCP in the past. Pt states he usually goes to the ED for treatment when needed. EHR reviewed. Pt was seen in the ED on 02/26/2017 for polyuria, polydipsia, and and blurred vision. Pt had a blood sugar over 700 at this time. Pt was given IV fluids and IV insulin and discharged home on metformin and HCTZ. Pt was to follow up with PCP in one week after discharge from ED. Pt did not do this. Pt presented to the ED again on 05/16/2019 for shoulder pain. Pt had a blood sugar greater than 500 at this time. Pt told the ED physician he had not followed up with PCP after discharged from the ED in 2018. Pt states he only took the metformin and HCTZ until he ran out and never followed up for continued care. Pt was placed back on HCTZ and metformin 500 mg twice daily on last ED visit.  Pt reports he does not have insurance and finds it hard to see a provider and get medications. He states he has been taking his medications as prescribed since 05/16/2019. He denies polyuria, polydipsia, or polyphagia. No visual changes. Pt does report erectile dysfunction over the last 6 months. States he is able to get an erection and ejaculate but is unable to sustain an erection for long periods of time. No hematuria, penile pain, penile swelling, or discharge. No other reported symptoms. Pt does not watch his diet or exercise on a regular basis.   Relevant past medical, surgical, family, and social history reviewed and updated as indicated.  Allergies and medications reviewed and updated. Date reviewed: Chart in Epic.   Past Medical History:    Diagnosis Date  . Diabetes mellitus without complication ([place])   . Hypertension     Past Surgical History:  Procedure Laterality Date  . HERNIA REPAIR      Social History   Socioeconomic History  . Marital status: Single    Spouse name: Not on file  . Number of children: 6  . Years of education: Not on file  . Highest education level: Not on file  Occupational History  . Not on file  Tobacco Use  . Smoking status: Never Smoker  . Smokeless tobacco: Never Used  Substance and Sexual Activity  . Alcohol use: No  . Drug use: No  . Sexual activity: Not on file  Other Topics Concern  . Not on file  Social History Narrative  . Not on file   Social Determinants of Health   Financial Resource Strain:   . Difficulty of Paying Living Expenses: Not on file  Food Insecurity:   . Worried About Charity fundraiser in the Last Year: Not on file  . Ran Out of Food in the Last Year: Not on file  Transportation Needs:   . Lack of Transportation (Medical): Not on file  . Lack of Transportation (Non-Medical): Not on file  Physical Activity:   . Days of Exercise per Week: Not on file  . Minutes of Exercise per Session:  Not on file  Stress:   . Feeling of Stress : Not on file  Social Connections:   . Frequency of Communication with Friends and Family: Not on file  . Frequency of Social Gatherings with Friends and Family: Not on file  . Attends Religious Services: Not on file  . Active Member of Clubs or Organizations: Not on file  . Attends Archivist Meetings: Not on file  . Marital Status: Not on file  Intimate Partner Violence:   . Fear of Current or Ex-Partner: Not on file  . Emotionally Abused: Not on file  . Physically Abused: Not on file  . Sexually Abused: Not on file    Outpatient Encounter Medications as of 05/30/2019  Medication Sig  . aspirin EC 81 MG tablet Take 81 mg by mouth in the morning and at bedtime.  . hydrochlorothiazide (HYDRODIURIL) 25 MG  tablet Take 1 tablet (25 mg total) by mouth daily.  . Multiple Vitamin (MULTIVITAMIN WITH MINERALS) TABS tablet Take 1 tablet by mouth daily.  . [DISCONTINUED] hydrochlorothiazide (HYDRODIURIL) 25 MG tablet Take 1 tablet (25 mg total) by mouth daily.  . [DISCONTINUED] hydrochlorothiazide (HYDRODIURIL) 25 MG tablet Take 1 tablet (25 mg total) by mouth daily.  . [DISCONTINUED] metFORMIN (GLUCOPHAGE) 500 MG tablet Take 1 tablet (500 mg total) by mouth 2 (two) times daily with a meal.  . losartan (COZAAR) 25 MG tablet Take 1 tablet (25 mg total) by mouth daily.  . metFORMIN (GLUCOPHAGE) 1000 MG tablet Take 1 tablet (1,000 mg total) by mouth 2 (two) times daily with a meal.   No facility-administered encounter medications on file as of 05/30/2019.    No Known Allergies  Review of Systems  Constitutional: Negative for activity change, appetite change, chills, diaphoresis, fatigue, fever and unexpected weight change.  HENT: Negative.   Eyes: Negative.  Negative for photophobia and visual disturbance.  Respiratory: Negative for cough, chest tightness and shortness of breath.   Cardiovascular: Negative for chest pain, palpitations and leg swelling.  Gastrointestinal: Negative for abdominal pain, blood in stool, constipation, diarrhea, nausea and vomiting.  Endocrine: Negative.  Negative for polydipsia, polyphagia and polyuria.  Genitourinary: Negative for decreased urine volume, difficulty urinating, dysuria, enuresis, flank pain, frequency, genital sores, hematuria, penile pain, penile swelling, scrotal swelling, testicular pain and urgency.       Erectile dysfunction  Musculoskeletal: Negative for arthralgias and myalgias.  Skin: Negative.   Allergic/Immunologic: Negative.   Neurological: Negative for dizziness, tremors, seizures, syncope, facial asymmetry, speech difficulty, weakness, light-headedness, numbness and headaches.  Hematological: Negative.   Psychiatric/Behavioral: Negative for  confusion, hallucinations, sleep disturbance and suicidal ideas.  All other systems reviewed and are negative.       Objective:  BP (!) 168/102 (BP Location: Left Arm, Cuff Size: Large)   Pulse 87   Temp 99 F (37.2 C)   Resp 20   Ht 6' 1"  (1.854 m)   Wt 269 lb (122 kg)   SpO2 99%   BMI 35.49 kg/m    Wt Readings from Last 3 Encounters:  05/30/19 269 lb (122 kg)  05/16/19 300 lb (136.1 kg)    Physical Exam Vitals and nursing note reviewed.  Constitutional:      General: He is not in acute distress.    Appearance: Normal appearance. He is well-developed and well-groomed. He is morbidly obese. He is not ill-appearing, toxic-appearing or diaphoretic.  HENT:     Head: Normocephalic and atraumatic.     Jaw: There  is normal jaw occlusion.     Right Ear: Hearing normal.     Left Ear: Hearing normal.     Nose: Nose normal.     Mouth/Throat:     Lips: Pink.     Mouth: Mucous membranes are moist.     Pharynx: Oropharynx is clear. Uvula midline.  Eyes:     General: Lids are normal.     Extraocular Movements: Extraocular movements intact.     Conjunctiva/sclera: Conjunctivae normal.     Pupils: Pupils are equal, round, and reactive to light.  Neck:     Thyroid: No thyroid mass, thyromegaly or thyroid tenderness.     Vascular: No carotid bruit or JVD.     Trachea: Trachea and phonation normal.  Cardiovascular:     Rate and Rhythm: Normal rate and regular rhythm.     Chest Wall: PMI is not displaced.     Pulses: Normal pulses.     Heart sounds: Normal heart sounds. No murmur. No friction rub. No gallop.   Pulmonary:     Effort: Pulmonary effort is normal. No respiratory distress.     Breath sounds: Normal breath sounds. No wheezing.  Abdominal:     General: Bowel sounds are normal. There is no distension or abdominal bruit.     Palpations: Abdomen is soft. There is no hepatomegaly or splenomegaly.     Tenderness: There is no abdominal tenderness. There is no right CVA  tenderness or left CVA tenderness.     Hernia: No hernia is present.  Musculoskeletal:        General: Normal range of motion.     Cervical back: Normal range of motion and neck supple.     Right lower leg: No edema.     Left lower leg: No edema.  Lymphadenopathy:     Cervical: No cervical adenopathy.  Skin:    General: Skin is warm and dry.     Capillary Refill: Capillary refill takes less than 2 seconds.     Coloration: Skin is not cyanotic, jaundiced or pale.     Findings: No rash.  Neurological:     General: No focal deficit present.     Mental Status: He is alert and oriented to person, place, and time.     Cranial Nerves: Cranial nerves are intact. No cranial nerve deficit.     Sensory: Sensation is intact. No sensory deficit.     Motor: Motor function is intact. No weakness.     Coordination: Coordination is intact. Coordination normal.     Gait: Gait is intact. Gait normal.     Deep Tendon Reflexes: Reflexes are normal and symmetric. Reflexes normal.  Psychiatric:        Attention and Perception: Attention and perception normal.        Mood and Affect: Mood and affect normal.        Speech: Speech normal.        Behavior: Behavior normal. Behavior is cooperative.        Thought Content: Thought content normal.        Cognition and Memory: Cognition and memory normal.        Judgment: Judgment normal.     Results for orders placed or performed in visit on 05/30/19  Bayer DCA Hb A1c Waived  Result Value Ref Range   HB A1C (BAYER DCA - WAIVED) >14.0 (H) <7.0 %       Pertinent labs & imaging results that were available during  my care of the patient were reviewed by me and considered in my medical decision making.  Assessment & Plan:  Ayyub was seen today for establish care.  Diagnoses and all orders for this visit:  Type 2 diabetes mellitus with hyperglycemia, without long-term current use of insulin (HCC) A1C greater than 14 today. Will increase metformin to 1000  mg twice daily along with strict lifestyle changes. Pt to follow up in 2 weeks for reevaluation. Labs pending. Diet and exercise discussed in detail. Pt aware of need for eye exam.  -     CMP14+EGFR -     Lipid panel -     Thyroid Panel With TSH -     Bayer DCA Hb A1c Waived -     metFORMIN (GLUCOPHAGE) 1000 MG tablet; Take 1 tablet (1,000 mg total) by mouth 2 (two) times daily with a meal.  Hypertension associated with diabetes (HCC) BP poorly controlled. Changes were made in regimen today, added Cozaar 25 mg daily. Goal BP is 130/80. Pt aware to report any persistent high or low readings. DASH diet and exercise encouraged. Exercise at least 150 minutes per week and increase as tolerated. Goal BMI > 25. Stress management encouraged. Avoid nicotine and tobacco product use. Avoid excessive alcohol and NSAID's. Avoid more than 2000 mg of sodium daily. Medications as prescribed. Follow up as scheduled.  -     CMP14+EGFR -     Lipid panel -     Thyroid Panel With TSH -     Bayer DCA Hb A1c Waived -     losartan (COZAAR) 25 MG tablet; Take 1 tablet (25 mg total) by mouth daily. -     hydrochlorothiazide (HYDRODIURIL) 25 MG tablet; Take 1 tablet (25 mg total) by mouth daily.  Erectile dysfunction due to type 2 diabetes mellitus ([place]) Likely related to uncontrolled DM and HTN. Long discussion pertaining to disease process and end organ damage associated with DM and HTN. Pt verbalized understanding. Pt aware this should improve once BP and BS are better controlled. If not improving, will refer to urology.   Morbid obesity ([place]) Diet and exercise encouraged. Labs pending.  -     CMP14+EGFR -     Lipid panel -     Thyroid Panel With TSH -     Bayer DCA Hb A1c Waived     Continue all other maintenance medications.  Follow up plan: Return in about 2 weeks (around 06/13/2019), or if symptoms worsen or fail to improve, for BP.  Continue healthy lifestyle choices, including diet (rich in fruits,  vegetables, and lean proteins, and low in salt and simple carbohydrates) and exercise (at least 30 minutes of moderate physical activity daily).  Educational handout given for DASH diet, DM  The above assessment and management plan was discussed with the patient. The patient verbalized understanding of and has agreed to the management plan. Patient is aware to call the clinic if they develop any new symptoms or if symptoms persist or worsen. Patient is aware when to return to the clinic for a follow-up visit. Patient educated on when it is appropriate to go to the emergency department.   Monia Pouch, FNP-C [facility] [phone]

## 2019-05-31 LAB — CMP14+EGFR
ALT: 20 IU/L (ref 0–44)
AST: 13 IU/L (ref 0–40)
Albumin/Globulin Ratio: 1.4 (ref 1.2–2.2)
Albumin: 3.9 g/dL (ref 3.8–4.9)
Alkaline Phosphatase: 89 IU/L (ref 39–117)
BUN/Creatinine Ratio: 14 (ref 9–20)
BUN: 17 mg/dL (ref 6–24)
Bilirubin Total: 0.3 mg/dL (ref 0.0–1.2)
CO2: 25 mmol/L (ref 20–29)
Calcium: 9.3 mg/dL (ref 8.7–10.2)
Chloride: 99 mmol/L (ref 96–106)
Creatinine, Ser: 1.18 mg/dL (ref 0.76–1.27)
GFR calc Af Amer: 82 mL/min/1.73
GFR calc non Af Amer: 71 mL/min/1.73
Globulin, Total: 2.7 g/dL (ref 1.5–4.5)
Glucose: 289 mg/dL — ABNORMAL HIGH (ref 65–99)
Potassium: 3.7 mmol/L (ref 3.5–5.2)
Sodium: 139 mmol/L (ref 134–144)
Total Protein: 6.6 g/dL (ref 6.0–8.5)

## 2019-05-31 LAB — THYROID PANEL WITH TSH
Free Thyroxine Index: 2.2 (ref 1.2–4.9)
T3 Uptake Ratio: 27 % (ref 24–39)
T4, Total: 8.3 ug/dL (ref 4.5–12.0)
TSH: 0.803 u[IU]/mL (ref 0.450–4.500)

## 2019-05-31 LAB — LIPID PANEL
Chol/HDL Ratio: 4.1 ratio (ref 0.0–5.0)
Cholesterol, Total: 188 mg/dL (ref 100–199)
HDL: 46 mg/dL (ref >39)
LDL Chol Calc (NIH): 104 mg/dL — ABNORMAL HIGH (ref 0–99)
Triglycerides: 222 mg/dL — ABNORMAL HIGH (ref 0–149)
VLDL Cholesterol Cal: 38 mg/dL (ref 5–40)

## 2019-06-04 ENCOUNTER — Telehealth: Payer: Self-pay | Admitting: Family Medicine

## 2019-06-04 NOTE — Telephone Encounter (Signed)
Pt called stating that he received a missed call from [place] regarding lab results. Went over lab results with pt per Dr Reginia Forts notes and scheduled pt for 3 mth check up. Pt voiced understanding.

## 2019-06-13 ENCOUNTER — Ambulatory Visit: Payer: Self-pay | Admitting: Family Medicine

## 2019-07-11 ENCOUNTER — Ambulatory Visit: Payer: Self-pay | Admitting: Family Medicine

## 2019-07-14 ENCOUNTER — Encounter: Payer: Self-pay | Admitting: Family Medicine

## 2019-07-23 ENCOUNTER — Encounter: Payer: Self-pay | Admitting: *Deleted

## 2019-08-27 ENCOUNTER — Ambulatory Visit: Payer: Self-pay | Admitting: Family Medicine

## 2019-09-05 ENCOUNTER — Ambulatory Visit: Payer: Self-pay | Admitting: Family Medicine

## 2019-10-06 ENCOUNTER — Ambulatory Visit: Payer: Self-pay | Admitting: Family Medicine

## 2019-11-07 ENCOUNTER — Ambulatory Visit: Payer: Self-pay | Admitting: Nurse Practitioner

## 2019-11-11 ENCOUNTER — Encounter: Payer: Self-pay | Admitting: Family Medicine

## 2020-05-07 ENCOUNTER — Ambulatory Visit (INDEPENDENT_AMBULATORY_CARE_PROVIDER_SITE_OTHER): Payer: Commercial Managed Care - PPO | Admitting: Nurse Practitioner

## 2020-05-07 ENCOUNTER — Other Ambulatory Visit: Payer: Self-pay

## 2020-05-07 ENCOUNTER — Encounter: Payer: Self-pay | Admitting: Nurse Practitioner

## 2020-05-07 VITALS — BP 194/103 | HR 70 | Temp 97.7°F | Ht 73.0 in | Wt 282.0 lb

## 2020-05-07 DIAGNOSIS — I152 Hypertension secondary to endocrine disorders: Secondary | ICD-10-CM

## 2020-05-07 DIAGNOSIS — M25511 Pain in right shoulder: Secondary | ICD-10-CM

## 2020-05-07 DIAGNOSIS — E1165 Type 2 diabetes mellitus with hyperglycemia: Secondary | ICD-10-CM | POA: Diagnosis not present

## 2020-05-07 DIAGNOSIS — E1159 Type 2 diabetes mellitus with other circulatory complications: Secondary | ICD-10-CM | POA: Diagnosis not present

## 2020-05-07 MED ORDER — METHYLPREDNISOLONE ACETATE 40 MG/ML IJ SUSP
40.0000 mg | Freq: Once | INTRAMUSCULAR | Status: AC
  Administered 2020-05-07: 40 mg via INTRAMUSCULAR

## 2020-05-07 MED ORDER — HYDROCHLOROTHIAZIDE 25 MG PO TABS: 25.0000 mg | ORAL_TABLET | Freq: Every day | ORAL | 5 refills | Status: DC

## 2020-05-07 MED ORDER — LOSARTAN POTASSIUM 25 MG PO TABS: 25.0000 mg | ORAL_TABLET | Freq: Every day | ORAL | 0 refills | Status: DC

## 2020-05-07 MED ORDER — METFORMIN HCL 1000 MG PO TABS: 1000.0000 mg | ORAL_TABLET | Freq: Two times a day (BID) | ORAL | 3 refills | Status: DC

## 2020-05-07 MED ORDER — DICLOFENAC SODIUM 75 MG PO TBEC: 75.0000 mg | DELAYED_RELEASE_TABLET | Freq: Two times a day (BID) | ORAL | 0 refills | Status: DC

## 2020-05-07 NOTE — Assessment & Plan Note (Signed)
Acute right shoulder pain not well managed.  This is recurrent for patient in the last 1 year.  Started patient on Voltaren 75 mg tablet daily, continue icy hot gel, cool compress, referral to orthopedic surgery completed., Depo-Medrol shot given in office.  Follow-up with worsening or unresolved symptoms. Education provided with printed handouts given.  Patient verbalized understanding.

## 2020-05-07 NOTE — Patient Instructions (Signed)
Shoulder Pain Many things can cause shoulder pain, including:  An injury to the shoulder.  Overuse of the shoulder.  Arthritis. The source of the pain can be:  Inflammation.  An injury to the shoulder joint.  An injury to a tendon, ligament, or bone. Follow these instructions at home: Pay attention to changes in your symptoms. Let your health care provider know about them. Follow these instructions to relieve your pain. If you have a sling:  Wear the sling as told by your health care provider. Remove it only as told by your health care provider.  Loosen the sling if your fingers tingle, become numb, or turn cold and blue.  Keep the sling clean.  If the sling is not waterproof: ? Do not let it get wet. Remove it to shower or bathe.  Move your arm as little as possible, but keep your hand moving to prevent swelling. Managing pain, stiffness, and swelling  If directed, put ice on the painful area: ? Put ice in a plastic bag. ? Place a towel between your skin and the bag. ? Leave the ice on for 20 minutes, 2-3 times per day. Stop applying ice if it does not help with the pain.  Squeeze a soft ball or a foam pad as much as possible. This helps to keep the shoulder from swelling. It also helps to strengthen the arm.   General instructions  Take over-the-counter and prescription medicines only as told by your health care provider.  Keep all follow-up visits as told by your health care provider. This is important. Contact a health care provider if:  Your pain gets worse.  Your pain is not relieved with medicines.  New pain develops in your arm, hand, or fingers. Get help right away if:  Your arm, hand, or fingers: ? Tingle. ? Become numb. ? Become swollen. ? Become painful. ? Turn white or blue. Summary  Shoulder pain can be caused by an injury, overuse, or arthritis.  Pay attention to changes in your symptoms. Let your health care provider know about  them.  This condition may be treated with a sling, ice, and pain medicines.  Contact your health care provider if the pain gets worse or new pain develops. Get help right away if your arm, hand, or fingers tingle or become numb, swollen, or painful.  Keep all follow-up visits as told by your health care provider. This is important. This information is not intended to replace advice given to you by your health care provider. Make sure you discuss any questions you have with your health care provider. Document Revised: 10/16/2017 Document Reviewed: 10/16/2017 Elsevier Patient Education  2021 Elsevier Inc.  

## 2020-05-07 NOTE — Progress Notes (Signed)
New Patient Note  RE: Tim Higgins MRN: [id number] DOB: 1965/07/20 Date of Office Visit: 05/07/2020  Chief Complaint: Shoulder Pain (right)  History of Present Illness:   Pain  He reports recurrent right shoulder pain. was not an injury that may have caused the pain. The pain started about a year ago and is worsening. The pain does not radiate . The pain is described as aching and soreness, is 7/10 in intensity, occurring intermittently. Symptoms are worse in the: morning, mid-day, afternoon  Aggravating factors: rotation, lifting Relieving factors: medication Steroid shot.  He has tried NSAIDs with little relief.   ---------------------------------------------------------------------------------------------------  Assessment and Plan: Tim Higgins is a 55 y.o. male with: Acute pain of right shoulder Acute right shoulder pain not well managed.  This is recurrent for patient in the last 1 year.  Started patient on Voltaren 75 mg tablet daily, continue icy hot gel, cool compress, referral to orthopedic surgery completed., Depo-Medrol shot given in office.  Follow-up with worsening or unresolved symptoms. Education provided with printed handouts given.  Patient verbalized understanding.  Return in about 2 weeks (around 05/21/2020) for Physical /Labs.   Diagnostics:   Past Medical History: Patient Active Problem List   Diagnosis Date Noted  . Acute pain of right shoulder 05/07/2020  . Type 2 diabetes mellitus with hyperglycemia, without long-term current use of insulin (HCC) 05/30/2019  . Hypertension associated with diabetes (HCC) 05/30/2019  . Erectile dysfunction due to type 2 diabetes mellitus (HCC) 05/30/2019  . Morbid obesity (HCC) 05/30/2019   Past Medical History:  Diagnosis Date  . Diabetes mellitus without complication (HCC)   . Hypertension    Past Surgical History: Past Surgical History:  Procedure Laterality Date  . HERNIA REPAIR     Medication List:  Current  Outpatient Medications  Medication Sig Dispense Refill  . aspirin EC 81 MG tablet Take 81 mg by mouth in the morning and at bedtime.    . diclofenac (VOLTAREN) 75 MG EC tablet Take 1 tablet (75 mg total) by mouth 2 (two) times daily. 30 tablet 0  . Multiple Vitamin (MULTIVITAMIN WITH MINERALS) TABS tablet Take 1 tablet by mouth daily.    . hydrochlorothiazide (HYDRODIURIL) 25 MG tablet Take 1 tablet (25 mg total) by mouth daily. 30 tablet 5  . losartan (COZAAR) 25 MG tablet Take 1 tablet (25 mg total) by mouth daily. 90 tablet 0  . metFORMIN (GLUCOPHAGE) 1000 MG tablet Take 1 tablet (1,000 mg total) by mouth 2 (two) times daily with a meal. 180 tablet 3   No current facility-administered medications for this visit.   Allergies: No Known Allergies Social History: Social History   Socioeconomic History  . Marital status: Single    Spouse name: Not on file  . Number of children: 6  . Years of education: Not on file  . Highest education level: Not on file  Occupational History  . Not on file  Tobacco Use  . Smoking status: Never Smoker  . Smokeless tobacco: Never Used  Vaping Use  . Vaping Use: Never used  Substance and Sexual Activity  . Alcohol use: No  . Drug use: No  . Sexual activity: Not on file  Other Topics Concern  . Not on file  Social History Narrative  . Not on file   Social Determinants of Health   Financial Resource Strain: Not on file  Food Insecurity: Not on file  Transportation Needs: Not on file  Physical Activity: Not on file  Stress: Not on file  Social Connections: Not on file       Family History: Family History  Problem Relation Age of Onset  . Diabetes Mother          Review of Systems  Constitutional: Positive for activity change. Negative for appetite change, chills, fatigue and fever.  HENT: Negative.   Eyes: Negative.   Respiratory: Negative.   Cardiovascular: Negative.   Genitourinary: Negative.   Musculoskeletal: Positive for  arthralgias.  Skin: Negative.   Neurological: Negative.   Hematological: Negative.   Psychiatric/Behavioral: Negative.   All other systems reviewed and are negative.  Objective: BP (!) 194/103   Pulse 70   Temp 97.7 F (36.5 C)   Ht 6\' 1"  (1.854 m)   Wt 282 lb (127.9 kg)   SpO2 98%   BMI 37.21 kg/m  Body mass index is 37.21 kg/m. Physical Exam Vitals reviewed.  Constitutional:      Appearance: Normal appearance.  HENT:     Head: Normocephalic.     Nose: Nose normal.  Eyes:     Conjunctiva/sclera: Conjunctivae normal.  Cardiovascular:     Rate and Rhythm: Normal rate and regular rhythm.     Pulses: Normal pulses.     Heart sounds: Normal heart sounds.  Pulmonary:     Effort: Pulmonary effort is normal.     Breath sounds: Normal breath sounds.  Abdominal:     General: Bowel sounds are normal.  Musculoskeletal:        General: Tenderness present.  Skin:    General: Skin is warm.  Neurological:     Mental Status: He is alert and oriented to person, place, and time.  Psychiatric:        Mood and Affect: Mood normal.        Behavior: Behavior normal.    The plan was reviewed with the patient/family, and all questions/concerned were addressed.  It was my pleasure to see Tim Higgins today and participate in his care. Please feel free to contact me with any questions or concerns.  Sincerely,  NP [facility]

## 2020-05-20 ENCOUNTER — Encounter: Payer: Self-pay | Admitting: Radiology

## 2020-05-21 ENCOUNTER — Ambulatory Visit: Payer: Commercial Managed Care - PPO | Admitting: Family Medicine

## 2020-05-21 ENCOUNTER — Ambulatory Visit: Payer: Commercial Managed Care - PPO | Admitting: Nurse Practitioner

## 2020-05-26 ENCOUNTER — Encounter: Payer: Self-pay | Admitting: Orthopaedic Surgery

## 2020-05-31 ENCOUNTER — Other Ambulatory Visit: Payer: Self-pay | Admitting: *Deleted

## 2020-05-31 DIAGNOSIS — M25511 Pain in right shoulder: Secondary | ICD-10-CM

## 2020-05-31 MED ORDER — DICLOFENAC SODIUM 75 MG PO TBEC: 75.0000 mg | DELAYED_RELEASE_TABLET | Freq: Two times a day (BID) | ORAL | 0 refills | Status: AC

## 2020-06-02 ENCOUNTER — Ambulatory Visit: Payer: Self-pay

## 2020-06-23 ENCOUNTER — Ambulatory Visit: Payer: Self-pay

## 2020-09-03 ENCOUNTER — Encounter: Payer: Self-pay | Admitting: Nurse Practitioner

## 2020-09-03 ENCOUNTER — Encounter: Payer: Commercial Managed Care - PPO | Admitting: Nurse Practitioner

## 2021-01-25 DIAGNOSIS — R030 Elevated blood-pressure reading, without diagnosis of hypertension: Secondary | ICD-10-CM | POA: Diagnosis not present

## 2021-01-25 DIAGNOSIS — E119 Type 2 diabetes mellitus without complications: Secondary | ICD-10-CM | POA: Diagnosis not present

## 2021-01-25 DIAGNOSIS — R21 Rash and other nonspecific skin eruption: Secondary | ICD-10-CM | POA: Diagnosis not present

## 2021-01-25 DIAGNOSIS — I10 Essential (primary) hypertension: Secondary | ICD-10-CM | POA: Diagnosis not present

## 2021-01-25 DIAGNOSIS — Z7984 Long term (current) use of oral hypoglycemic drugs: Secondary | ICD-10-CM | POA: Diagnosis not present

## 2021-01-25 DIAGNOSIS — B029 Zoster without complications: Secondary | ICD-10-CM | POA: Diagnosis not present

## 2021-01-26 ENCOUNTER — Encounter: Payer: Self-pay | Admitting: Nurse Practitioner

## 2021-01-26 ENCOUNTER — Ambulatory Visit (INDEPENDENT_AMBULATORY_CARE_PROVIDER_SITE_OTHER): Payer: Commercial Managed Care - PPO | Admitting: Nurse Practitioner

## 2021-01-26 DIAGNOSIS — R52 Pain, unspecified: Secondary | ICD-10-CM | POA: Insufficient documentation

## 2021-01-26 DIAGNOSIS — R21 Rash and other nonspecific skin eruption: Secondary | ICD-10-CM

## 2021-01-26 MED ORDER — OXYCODONE HCL 5 MG PO CAPS: 5.0000 mg | ORAL_CAPSULE | ORAL | 0 refills | Status: AC | PRN

## 2021-01-26 NOTE — Assessment & Plan Note (Signed)
Re ordered oxycodone for 2 more days for shingle pain. Patient knows to follow up with worsening unresolved symptoms.

## 2021-01-26 NOTE — Progress Notes (Signed)
   Virtual Visit  Note Due to COVID-19 pandemic this visit was conducted virtually. This visit type was conducted due to national recommendations for restrictions regarding the COVID-19 Pandemic (e.g. social distancing, sheltering in place) in an effort to limit this patient's exposure and mitigate transmission in our community. All issues noted in this document were discussed and addressed.  A physical exam was not performed with this format.  I connected with Tim Higgins on 01/26/21 at 11:45 am  by telephone and verified that I am speaking with the correct person using two identifiers. Tim Higgins is currently located at home during visit. The provider, Daryll Drown, NP is located in their office at time of visit.  I discussed the limitations, risks, security and privacy concerns of performing an evaluation and management service by telephone and the availability of in person appointments. I also discussed with the patient that there may be a patient responsible charge related to this service. The patient expressed understanding and agreed to proceed.   History and Present Illness:  Muscle Pain This is a new problem. The current episode started yesterday. The problem occurs constantly. The problem is unchanged. Associated with: shingle rash. Pain location: left side abdomen. The pain is severe. Associated symptoms include a rash. Past treatments include prescription narcotic (Valtrex). The treatment provided significant relief.     Review of Systems  Respiratory: Negative.    Cardiovascular: Negative.   Gastrointestinal: Negative.   Genitourinary: Negative.   Skin:  Positive for rash.  All other systems reviewed and are negative.   Observations/Objective: Tele-visit patient not in distress  Assessment and Plan: Re ordered Oxycodone for 2 more days for shingle pain  Follow Up Instructions: Follow up with worsening unresolved symptoms    I discussed the assessment and  treatment plan with the patient. The patient was provided an opportunity to ask questions and all were answered. The patient agreed with the plan and demonstrated an understanding of the instructions.   The patient was advised to call back or seek an in-person evaluation if the symptoms worsen or if the condition fails to improve as anticipated.  The above assessment and management plan was discussed with the patient. The patient verbalized understanding of and has agreed to the management plan. Patient is aware to call the clinic if symptoms persist or worsen. Patient is aware when to return to the clinic for a follow-up visit. Patient educated on when it is appropriate to go to the emergency department.   Time call ended:  11:52 am   I provided 7 minutes of  non face-to-face time during this encounter.    Daryll Drown, NP

## 2021-03-24 IMAGING — DX DG SHOULDER 2+V*R*
3 series · 3 of 3 positions shown · non-contrast
Comparison: None.

CLINICAL DATA: Right shoulder pain for 2 weeks. No known injury.

EXAM:
RIGHT SHOULDER - 2+ VIEW

[shoulder grashey]
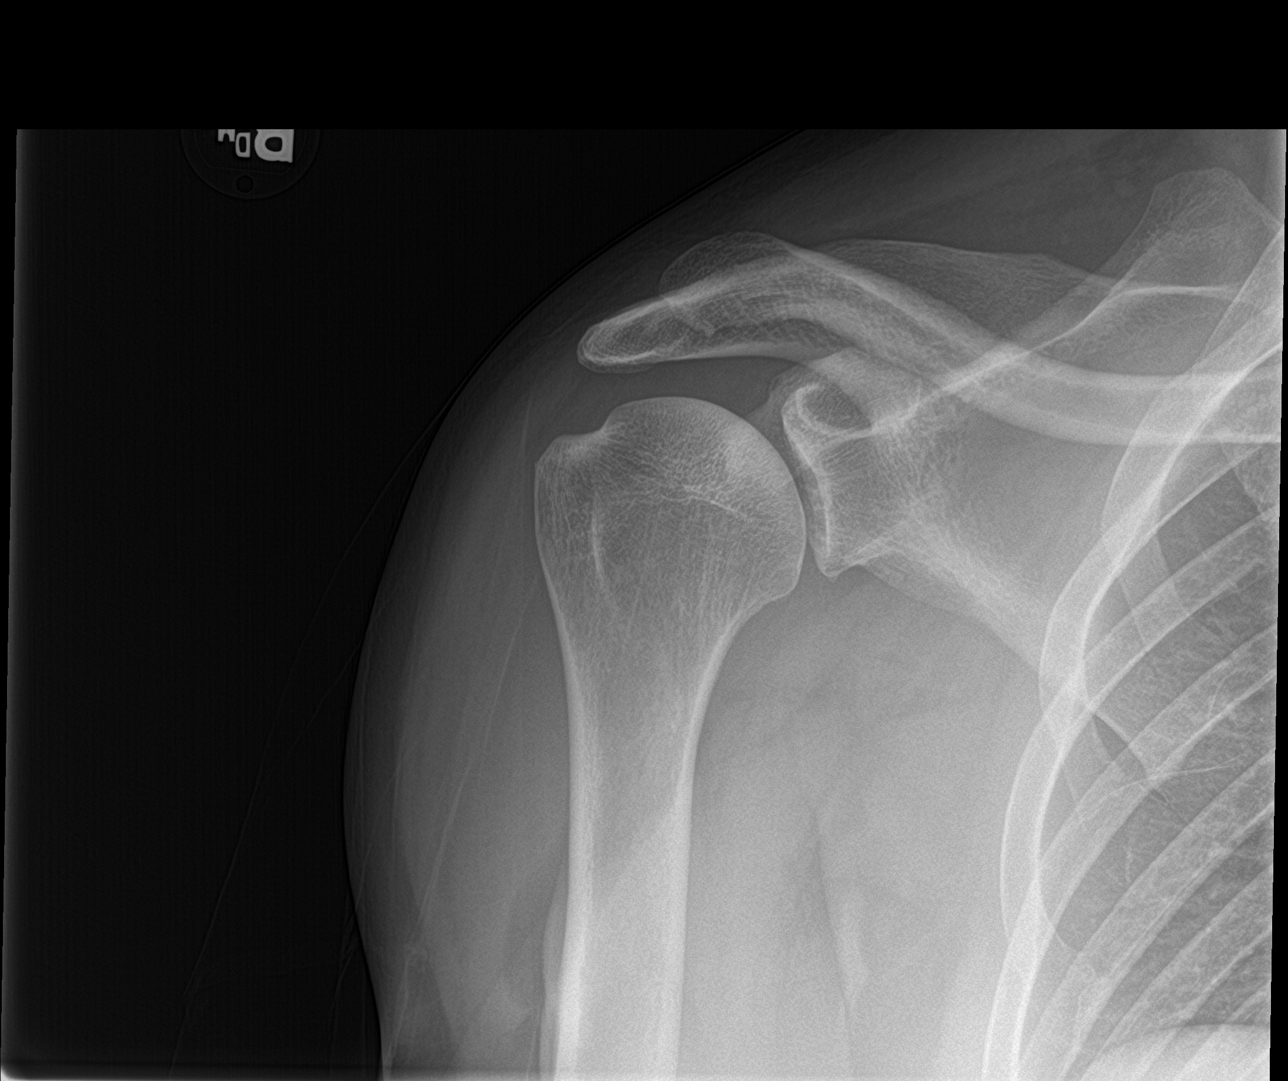

[shoulder y view]
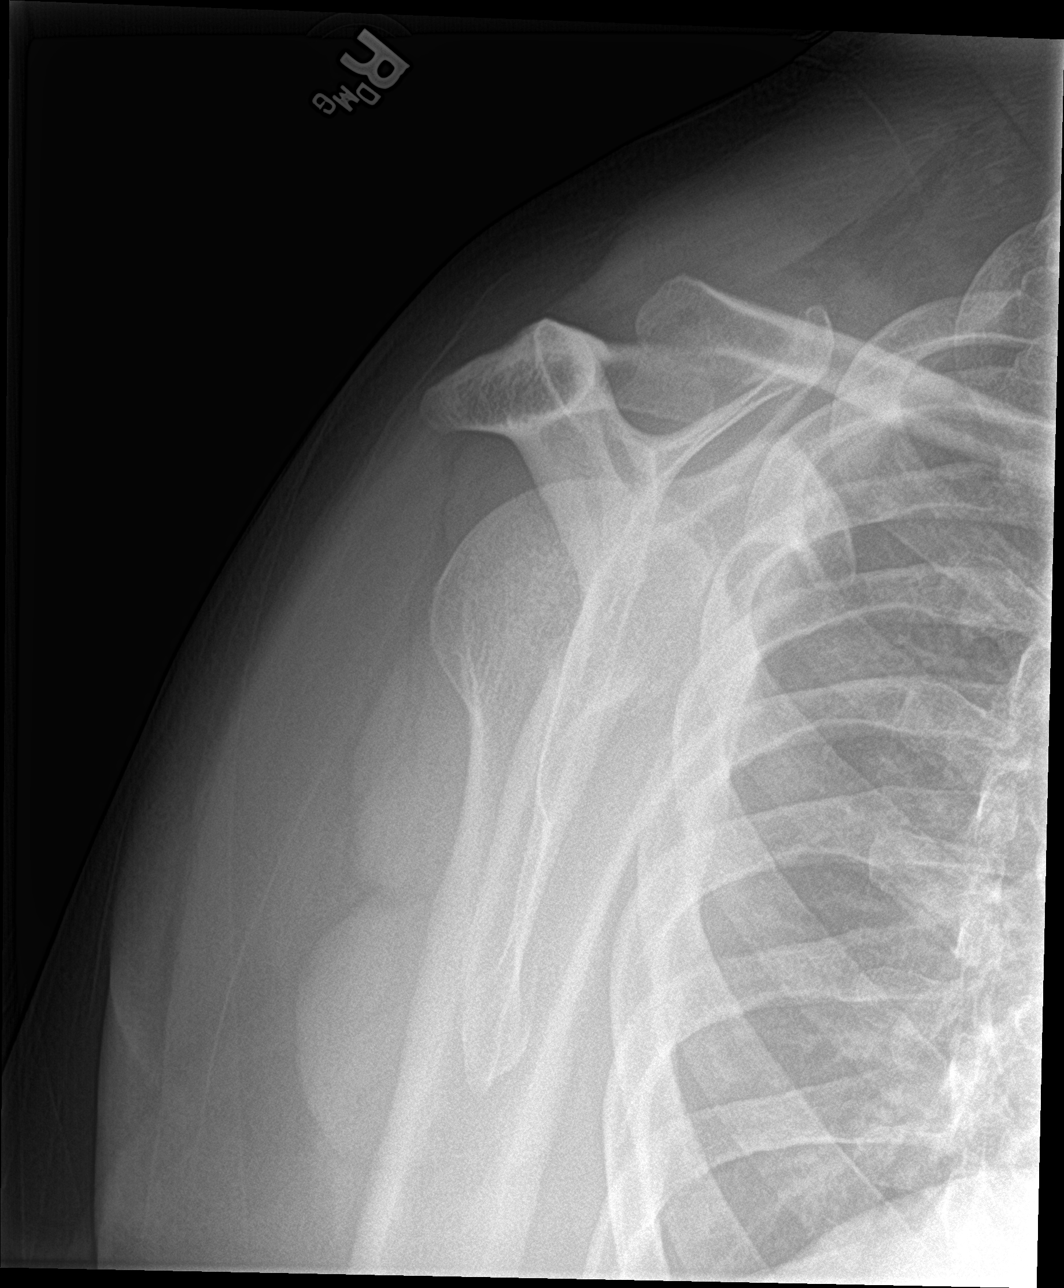

[shoulder axillary]
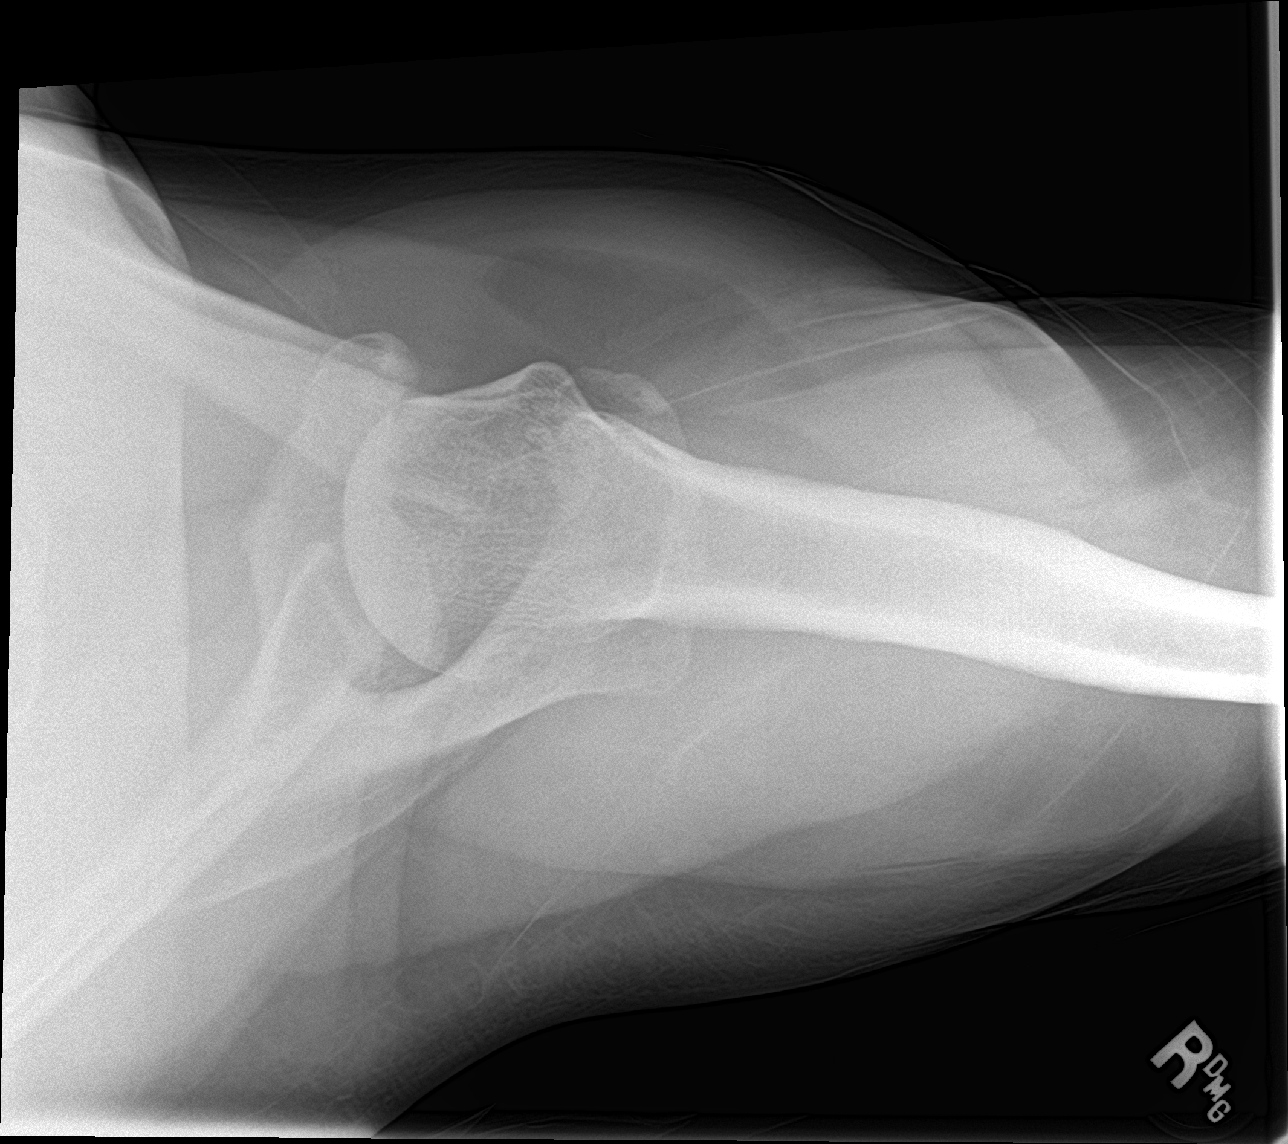

[3 of 3 positions shown; findings below may reference images not displayed]

FINDINGS: There is no evidence of fracture or dislocation. Mild glenohumeral
osteoarthritis with subchondral cystic change. Trace
acromioclavicular osteoarthritis tiny inferiorly directed spurs.
Soft tissues are unremarkable.
IMPRESSION: Mild glenohumeral and acromioclavicular osteoarthritis.

## 2021-05-19 ENCOUNTER — Other Ambulatory Visit: Payer: Self-pay | Admitting: Nurse Practitioner

## 2021-05-19 DIAGNOSIS — E1159 Type 2 diabetes mellitus with other circulatory complications: Secondary | ICD-10-CM

## 2021-05-19 DIAGNOSIS — I152 Hypertension secondary to endocrine disorders: Secondary | ICD-10-CM

## 2021-05-23 ENCOUNTER — Ambulatory Visit: Payer: Self-pay | Admitting: Nurse Practitioner

## 2021-05-27 DIAGNOSIS — L989 Disorder of the skin and subcutaneous tissue, unspecified: Secondary | ICD-10-CM | POA: Diagnosis not present

## 2021-05-27 DIAGNOSIS — Z7984 Long term (current) use of oral hypoglycemic drugs: Secondary | ICD-10-CM | POA: Diagnosis not present

## 2021-05-27 DIAGNOSIS — E119 Type 2 diabetes mellitus without complications: Secondary | ICD-10-CM | POA: Diagnosis not present

## 2021-05-27 DIAGNOSIS — Z79899 Other long term (current) drug therapy: Secondary | ICD-10-CM | POA: Diagnosis not present

## 2021-05-27 DIAGNOSIS — L853 Xerosis cutis: Secondary | ICD-10-CM | POA: Diagnosis not present

## 2021-05-27 DIAGNOSIS — I10 Essential (primary) hypertension: Secondary | ICD-10-CM | POA: Diagnosis not present

## 2021-05-30 ENCOUNTER — Encounter: Payer: Self-pay | Admitting: Nurse Practitioner

## 2021-06-13 ENCOUNTER — Ambulatory Visit (INDEPENDENT_AMBULATORY_CARE_PROVIDER_SITE_OTHER): Payer: 59 | Admitting: Nurse Practitioner

## 2021-06-13 ENCOUNTER — Encounter: Payer: Self-pay | Admitting: Nurse Practitioner

## 2021-06-13 ENCOUNTER — Other Ambulatory Visit: Payer: Self-pay | Admitting: Nurse Practitioner

## 2021-06-13 DIAGNOSIS — E1165 Type 2 diabetes mellitus with hyperglycemia: Secondary | ICD-10-CM | POA: Diagnosis not present

## 2021-06-13 DIAGNOSIS — E1159 Type 2 diabetes mellitus with other circulatory complications: Secondary | ICD-10-CM | POA: Diagnosis not present

## 2021-06-13 DIAGNOSIS — I152 Hypertension secondary to endocrine disorders: Secondary | ICD-10-CM

## 2021-06-13 DIAGNOSIS — Z139 Encounter for screening, unspecified: Secondary | ICD-10-CM

## 2021-06-13 LAB — BAYER DCA HB A1C WAIVED: HB A1C (BAYER DCA - WAIVED): 14 % — ABNORMAL HIGH (ref 4.8–5.6)

## 2021-06-13 MED ORDER — LOSARTAN POTASSIUM 25 MG PO TABS: 25.0000 mg | ORAL_TABLET | Freq: Every day | ORAL | 0 refills | Status: DC

## 2021-06-13 MED ORDER — JANUMET 50-1000 MG PO TABS: 1.0000 | ORAL_TABLET | Freq: Two times a day (BID) | ORAL | 3 refills | Status: AC

## 2021-06-13 MED ORDER — HYDROCHLOROTHIAZIDE 25 MG PO TABS: 25.0000 mg | ORAL_TABLET | Freq: Every day | ORAL | 5 refills | Status: DC

## 2021-06-13 NOTE — Assessment & Plan Note (Signed)
Completed A1c results pending, patient refused foot exam, last eye exam was 4 months ago,  Continue diabetic diet, exercise, weight loss, follow up in 3 months

## 2021-06-13 NOTE — Patient Instructions (Signed)
Diabetes Insipidus Diabetes insipidus (DI) is a rare condition that causes the body to produce more urine than normal. This leads to thirst and low fluid in the body (dehydration). In this condition, the urine is made mostly of water, or dilute urine. DI affects mostly adults, but it can happen at any age. There are four types of DI: Central DI. This is the most common type. Dipsogenic DI. Nephrogenic DI. Gestational DI. The most common forms of this condition are caused by a decrease in the production of the hormone that regulates urine output (antidiuretic hormone), or the body's resistance to this hormone. This condition is not related to type 1 or type 2 diabetes mellitus. What are the causes? Central DI is caused by damage to the pituitary gland or hypothalamus in the brain. Dipsogenic DI is caused by a defect in the thirst mechanism in the brain. This defect causes you to drink too much fluid. These may result from: Brain surgery. Infection. Inflammation. Brain tumor. Head injury. Nephrogenic DI is caused by the kidneys not responding to the antidiuretic hormone in the body. This may result from: Chronic kidney disease (CKD). Certain medicines, such as lithium. Low potassium levels. High calcium levels. Gestational DI is rare and is caused by the antidiuretic hormone that has stopped working properly. What are the signs or symptoms? Symptoms of this condition include: Excessive urination. This means urinating more than 10 cups (2.4 L) during a period of 24 hours. Excessive thirst. Too much nighttime urination (nocturia). Nausea. Diarrhea. How is this diagnosed? This condition may be diagnosed based on: Your medical history. A physical exam. Blood tests. Urine tests. A water deprivation test. During this test, you will stop drinking fluids for a period of time and your blood and urine will be checked regularly. An MRI. How is this treated? Once your specific type of  diabetes insipidus is diagnosed, treatment may include one or more of the following: Increasing or limiting your fluid intake. Taking medicines that contain artificial (synthetic) versions of the antidiuretic hormone. Stopping certain medicines that you take. Correcting the balance of minerals (electrolytes) in your body. Changing your diet. You may be put on a low-protein and low-sodium diet. You may need to visit your health care provider regularly to make sure your condition is being treated properly. You may also need to work with providers who specialize in: Kidney problems (nephrologist). Hormone disorders (endocrinologist). Follow these instructions at home: Eating and drinking Follow instructions from your health care provider about how much fluid and water to drink. You may be directed to drink more fluids and water, or to limit how much fluid and water you drink. Follow instructions from your health care provider about eating or drinking restrictions. General instructions Take over-the-counter and prescription medicines only as told by your health care provider. If directed, monitor your risk of dehydration in extreme heat. Carry a medical alert card or wear medical alert jewelry. Keep all follow-up visits as told by your health care provider. This is important. You may need to visit your health care provider regularly to make sure your condition is being treated properly. Contact a health care provider if: You continue to have symptoms after treatment. Get help right away if: You have extreme thirst. You have symptoms of severe dehydration, such as rapid heart rate, muscle cramps, or confusion. Summary Diabetes insipidus (DI) is a rare condition that causes the body to produce more urine than normal, which leads to thirst and dehydration. Follow instructions from  your health care provider about eating or drinking restrictions. Treatment may include increasing or limiting your  fluid intake and correcting the balance of minerals (electrolytes) in your body. Get help right away if you have symptoms of severe dehydration, such as rapid heart rate, muscle cramps, or confusion. This information is not intended to replace advice given to you by your health care provider. Make sure you discuss any questions you have with your health care provider. Document Revised: 05/07/2019 Document Reviewed: 05/07/2019 Elsevier Patient Education  2022 [employer] Hypertension, Adult Hypertension is another name for high blood pressure. High blood pressure forces your heart to work harder to pump blood. This can cause problems over time. There are two numbers in a blood pressure reading. There is a top number (systolic) over a bottom number (diastolic). It is best to have a blood pressure that is below 120/80. Healthy choices can help lower your blood pressure, or you may need medicine to help lower it. What are the causes? The cause of this condition is not known. Some conditions may be related to high blood pressure. What increases the risk? Smoking. Having type 2 diabetes mellitus, high cholesterol, or both. Not getting enough exercise or physical activity. Being overweight. Having too much fat, sugar, calories, or salt (sodium) in your diet. Drinking too much alcohol. Having long-term (chronic) kidney disease. Having a family history of high blood pressure. Age. Risk increases with age. Race. You may be at higher risk if you are African American. Gender. Men are at higher risk than women before age 61. After age 21, women are at higher risk than men. Having obstructive sleep apnea. Stress. What are the signs or symptoms? High blood pressure may not cause symptoms. Very high blood pressure (hypertensive crisis) may cause: Headache. Feelings of worry or nervousness (anxiety). Shortness of breath. Nosebleed. A feeling of being sick to your stomach (nausea). Throwing up  (vomiting). Changes in how you see. Very bad chest pain. Seizures. How is this treated? This condition is treated by making healthy lifestyle changes, such as: Eating healthy foods. Exercising more. Drinking less alcohol. Your health care provider may prescribe medicine if lifestyle changes are not enough to get your blood pressure under control, and if: Your top number is above 130. Your bottom number is above 80. Your personal target blood pressure may vary. Follow these instructions at home: Eating and drinking  If told, follow the DASH eating plan. To follow this plan: Fill one half of your plate at each meal with fruits and vegetables. Fill one fourth of your plate at each meal with whole grains. Whole grains include whole-wheat pasta, brown rice, and whole-grain bread. Eat or drink low-fat dairy products, such as skim milk or low-fat yogurt. Fill one fourth of your plate at each meal with low-fat (lean) proteins. Low-fat proteins include fish, chicken without skin, eggs, beans, and tofu. Avoid fatty meat, cured and processed meat, or chicken with skin. Avoid pre-made or processed food. Eat less than 1,500 mg of salt each day. Do not drink alcohol if: Your doctor tells you not to drink. You are pregnant, may be pregnant, or are planning to become pregnant. If you drink alcohol: Limit how much you use to: 0-1 drink a day for women. 0-2 drinks a day for men. Be aware of how much alcohol is in your drink. In the [place] one drink equals one 12 oz bottle of beer (355 mL), one 5 oz glass of wine (148 mL), or one  1 oz glass of hard liquor (44 mL). Lifestyle  Work with your doctor to stay at a healthy weight or to lose weight. Ask your doctor what the best weight is for you. Get at least 30 minutes of exercise most days of the week. This may include walking, swimming, or biking. Get at least 30 minutes of exercise that strengthens your muscles (resistance exercise) at least 3 days a  week. This may include lifting weights or doing Pilates. Do not use any products that contain nicotine or tobacco, such as cigarettes, e-cigarettes, and chewing tobacco. If you need help quitting, ask your doctor. Check your blood pressure at home as told by your doctor. Keep all follow-up visits as told by your doctor. This is important. Medicines Take over-the-counter and prescription medicines only as told by your doctor. Follow directions carefully. Do not skip doses of blood pressure medicine. The medicine does not work as well if you skip doses. Skipping doses also puts you at risk for problems. Ask your doctor about side effects or reactions to medicines that you should watch for. Contact a doctor if you: Think you are having a reaction to the medicine you are taking. Have headaches that keep coming back (recurring). Feel dizzy. Have swelling in your ankles. Have trouble with your vision. Get help right away if you: Get a very bad headache. Start to feel mixed up (confused). Feel weak or numb. Feel faint. Have very bad pain in your: Chest. Belly (abdomen). Throw up more than once. Have trouble breathing. Summary Hypertension is another name for high blood pressure. High blood pressure forces your heart to work harder to pump blood. For most people, a normal blood pressure is less than 120/80. Making healthy choices can help lower blood pressure. If your blood pressure does not get lower with healthy choices, you may need to take medicine. This information is not intended to replace advice given to you by your health care provider. Make sure you discuss any questions you have with your health care provider. Document Revised: 12/12/2017 Document Reviewed: 12/12/2017 Elsevier Patient Education  2022 [employer].

## 2021-06-13 NOTE — Assessment & Plan Note (Signed)
Hypertension not well controlled, patient is currently not taking medication as prescribed, and blame  elevation of blood pressure values on medication compliance. Education provided  to patient, printed hand out given

## 2021-06-13 NOTE — Assessment & Plan Note (Signed)
weight loss management clinic suggested, daily diet and exercise recomended Follow up in 3  Months.

## 2021-06-13 NOTE — Progress Notes (Signed)
Established Patient Office Visit  Subjective:  Patient ID: Tim Higgins, male    DOB: Mar 26, 1966  Age: 56 y.o. MRN: [id number]  CC:  Chief Complaint  Patient presents with   chronic disease management    HPI Juanantonio V Schorsch presents for follow up of hypertension. Patient was diagnosed in 05/30/2019 The patient is tolerating the medication well without side effects. Compliance with treatment has been good; including taking medication as directed , maintains a healthy diet and regular exercise regimen , and following up as directed.    The patient presents with history of type 2 diabetes mellitus without complications. Patient was diagnosed in 05/30/2019. Compliance with treatment has been good; the patient takes medication as directed , maintains a regular diet and an exercise regimen , follows up as directed , and is not keeping a glucose diary. Sugars runs: patient is not check blood sugar. Patient specifically denies associated symptoms, including blurred vision, fatigue, polydipsia, polyphagia and polyuria . Patient denies hypoglycemia. In regard to preventative care, the patient performs foot self-exams daily and last ophthalmology exam was in 02/2021.    Obesity: Patient's weight is not controlled, patient is trying to reduce calories in diet, and exercise. Education provided to patient as [facility] [facility] will be a good resource.  Past Medical History:  Diagnosis Date   Diabetes mellitus without complication (HCC)    Hypertension     Past Surgical History:  Procedure Laterality Date   HERNIA REPAIR      Family History  Problem Relation Age of Onset   Diabetes Mother     Social History   Socioeconomic History   Marital status: Single    Spouse name: Not on file   Number of children: 6   Years of education: Not on file   Highest education level: Not on file  Occupational History   Not on file  Tobacco Use   Smoking status: Never   Smokeless tobacco:  Never  Vaping Use   Vaping Use: Never used  Substance and Sexual Activity   Alcohol use: No   Drug use: No   Sexual activity: Not on file  Other Topics Concern   Not on file  Social History Narrative   Not on file   Social Determinants of Health   Financial Resource Strain: Not on file  Food Insecurity: Not on file  Transportation Needs: Not on file  Physical Activity: Not on file  Stress: Not on file  Social Connections: Not on file  Intimate Partner Violence: Not on file    Outpatient Medications Prior to Visit  Medication Sig Dispense Refill   aspirin EC 81 MG tablet Take 81 mg by mouth in the morning and at bedtime.     diclofenac (VOLTAREN) 75 MG EC tablet Take 1 tablet (75 mg total) by mouth 2 (two) times daily. 30 tablet 0   metFORMIN (GLUCOPHAGE) 1000 MG tablet Take 1 tablet (1,000 mg total) by mouth 2 (two) times daily with a meal. 180 tablet 3   Multiple Vitamin (MULTIVITAMIN WITH MINERALS) TABS tablet Take 1 tablet by mouth daily.     hydrochlorothiazide (HYDRODIURIL) 25 MG tablet Take 1 tablet (25 mg total) by mouth daily. 30 tablet 5   losartan (COZAAR) 25 MG tablet Take by mouth.     losartan (COZAAR) 25 MG tablet Take 1 tablet (25 mg total) by mouth daily. 90 tablet 0   No facility-administered medications prior to visit.    No  Known Allergies  ROS Review of Systems  Constitutional: Negative.   HENT: Negative.    Respiratory: Negative.    Gastrointestinal: Negative.   Genitourinary: Negative.   Musculoskeletal: Negative.   Skin: Negative.  Negative for rash.  Psychiatric/Behavioral: Negative.  The patient is not nervous/anxious.   All other systems reviewed and are negative.    Objective:    Physical Exam Vitals and nursing note reviewed.  Constitutional:      Appearance: Normal appearance.  HENT:     Right Ear: External ear normal.     Left Ear: External ear normal.     Nose: Nose normal.     Mouth/Throat:     Mouth: Mucous membranes are  moist.  Eyes:     Conjunctiva/sclera: Conjunctivae normal.  Cardiovascular:     Pulses: Normal pulses.     Heart sounds: Normal heart sounds.  Pulmonary:     Effort: Pulmonary effort is normal.     Breath sounds: Normal breath sounds.  Abdominal:     General: Bowel sounds are normal.  Musculoskeletal:        General: Normal range of motion.  Skin:    General: Skin is warm.     Findings: No rash.  Neurological:     General: No focal deficit present.     Mental Status: He is alert and oriented to person, place, and time.  Psychiatric:        Mood and Affect: Mood normal.        Behavior: Behavior normal.    BP (!) 180/107    Pulse 99    Temp 98.7 F (37.1 C)    Ht 6\' 1"  (1.854 m)    Wt 274 lb (124.3 kg)    SpO2 98%    BMI 36.15 kg/m  Wt Readings from Last 3 Encounters:  06/13/21 274 lb (124.3 kg)  05/07/20 282 lb (127.9 kg)  05/30/19 269 lb (122 kg)     Health Maintenance Due  Topic Date Due   URINE MICROALBUMIN  Never done   COLONOSCOPY (Pts 45-100yrs Insurance coverage will need to be confirmed)  Never done    There are no preventive care reminders to display for this patient.  Lab Results  Component Value Date   TSH 0.803 05/30/2019   Lab Results  Component Value Date   WBC 6.8 05/16/2019   HGB 13.5 05/16/2019   HCT 41.5 05/16/2019   MCV 84.9 05/16/2019   PLT 194 05/16/2019   Lab Results  Component Value Date   NA 139 05/30/2019   K 3.7 05/30/2019   CO2 25 05/30/2019   GLUCOSE 289 (H) 05/30/2019   BUN 17 05/30/2019   CREATININE 1.18 05/30/2019   BILITOT 0.3 05/30/2019   ALKPHOS 89 05/30/2019   AST 13 05/30/2019   ALT 20 05/30/2019   PROT 6.6 05/30/2019   ALBUMIN 3.9 05/30/2019   CALCIUM 9.3 05/30/2019   ANIONGAP 10 05/16/2019   Lab Results  Component Value Date   CHOL 188 05/30/2019   Lab Results  Component Value Date   HDL 46 05/30/2019   Lab Results  Component Value Date   LDLCALC 104 (H) 05/30/2019   Lab Results  Component Value  Date   TRIG 222 (H) 05/30/2019   Lab Results  Component Value Date   CHOLHDL 4.1 05/30/2019   Lab Results  Component Value Date   HGBA1C >14.0 (H) 05/30/2019      Assessment & Plan:   Problem List Items  Addressed This Visit       Cardiovascular and Mediastinum   Hypertension associated with diabetes (HCC)    Hypertension not well controlled, patient is currently not taking medication as prescribed, and blame  elevation of blood pressure values on medication compliance. Education provided  to patient, printed hand out given      Relevant Medications   losartan (COZAAR) 25 MG tablet   hydrochlorothiazide (HYDRODIURIL) 25 MG tablet     Endocrine   Type 2 diabetes mellitus with hyperglycemia, without long-term current use of insulin (HCC)    Completed A1c results pending, patient refused foot exam, last eye exam was 4 months ago,  Continue diabetic diet, exercise, weight loss, follow up in 3 months      Relevant Medications   losartan (COZAAR) 25 MG tablet   Other Relevant Orders   Bayer DCA Hb A1c Waived   Microalbumin / creatinine urine ratio     Other   Morbid obesity (HCC) - Primary    weight loss management clinic suggested, daily diet and exercise recomended Follow up in 3  Months.      Other Visit Diagnoses     Encounter for health-related screening       Relevant Orders   Cologuard       Meds ordered this encounter  Medications   losartan (COZAAR) 25 MG tablet    Sig: Take 1 tablet (25 mg total) by mouth daily.    Dispense:  90 tablet    Refill:  0   hydrochlorothiazide (HYDRODIURIL) 25 MG tablet    Sig: Take 1 tablet (25 mg total) by mouth daily.    Dispense:  30 tablet    Refill:  5    Follow-up: Return in about 3 months (around 09/10/2021).    Daryll Drown, NP

## 2021-06-14 LAB — MICROALBUMIN / CREATININE URINE RATIO
Creatinine, Urine: 50.6 mg/dL
Microalb/Creat Ratio: 8 mg/g creat (ref 0–29)
Microalbumin, Urine: 4 ug/mL

## 2021-09-19 ENCOUNTER — Encounter: Payer: Self-pay | Admitting: Nurse Practitioner

## 2021-09-19 ENCOUNTER — Ambulatory Visit: Payer: 59 | Admitting: Nurse Practitioner

## 2022-05-04 DIAGNOSIS — I10 Essential (primary) hypertension: Secondary | ICD-10-CM | POA: Diagnosis not present

## 2022-05-04 DIAGNOSIS — R000 Tachycardia, unspecified: Secondary | ICD-10-CM | POA: Diagnosis not present

## 2022-05-04 DIAGNOSIS — I959 Hypotension, unspecified: Secondary | ICD-10-CM | POA: Diagnosis not present

## 2022-05-04 DIAGNOSIS — T300 Burn of unspecified body region, unspecified degree: Secondary | ICD-10-CM | POA: Diagnosis not present

## 2022-05-05 DIAGNOSIS — T310 Burns involving less than 10% of body surface: Secondary | ICD-10-CM | POA: Diagnosis not present

## 2022-05-05 DIAGNOSIS — R9431 Abnormal electrocardiogram [ECG] [EKG]: Secondary | ICD-10-CM | POA: Diagnosis not present

## 2022-05-05 DIAGNOSIS — I5189 Other ill-defined heart diseases: Secondary | ICD-10-CM | POA: Diagnosis not present

## 2022-05-05 DIAGNOSIS — T24231A Burn of second degree of right lower leg, initial encounter: Secondary | ICD-10-CM | POA: Diagnosis not present

## 2022-05-05 DIAGNOSIS — E876 Hypokalemia: Secondary | ICD-10-CM | POA: Diagnosis not present

## 2022-05-05 DIAGNOSIS — X088XXA Exposure to other specified smoke, fire and flames, initial encounter: Secondary | ICD-10-CM | POA: Diagnosis not present

## 2022-05-05 DIAGNOSIS — Z7984 Long term (current) use of oral hypoglycemic drugs: Secondary | ICD-10-CM | POA: Diagnosis not present

## 2022-05-05 DIAGNOSIS — M6250 Muscle wasting and atrophy, not elsewhere classified, unspecified site: Secondary | ICD-10-CM | POA: Diagnosis not present

## 2022-05-05 DIAGNOSIS — T300 Burn of unspecified body region, unspecified degree: Secondary | ICD-10-CM | POA: Diagnosis not present

## 2022-05-05 DIAGNOSIS — Z9911 Dependence on respirator [ventilator] status: Secondary | ICD-10-CM | POA: Diagnosis not present

## 2022-05-05 DIAGNOSIS — Z6835 Body mass index (BMI) 35.0-35.9, adult: Secondary | ICD-10-CM | POA: Diagnosis not present

## 2022-05-05 DIAGNOSIS — E1142 Type 2 diabetes mellitus with diabetic polyneuropathy: Secondary | ICD-10-CM | POA: Diagnosis not present

## 2022-05-05 DIAGNOSIS — Z743 Need for continuous supervision: Secondary | ICD-10-CM | POA: Diagnosis not present

## 2022-05-05 DIAGNOSIS — E669 Obesity, unspecified: Secondary | ICD-10-CM | POA: Diagnosis not present

## 2022-05-05 DIAGNOSIS — S36119A Unspecified injury of liver, initial encounter: Secondary | ICD-10-CM | POA: Diagnosis not present

## 2022-05-05 DIAGNOSIS — E1165 Type 2 diabetes mellitus with hyperglycemia: Secondary | ICD-10-CM | POA: Diagnosis not present

## 2022-05-05 DIAGNOSIS — X062XXA Exposure to ignition of other clothing and apparel, initial encounter: Secondary | ICD-10-CM | POA: Diagnosis not present

## 2022-05-05 DIAGNOSIS — I10 Essential (primary) hypertension: Secondary | ICD-10-CM | POA: Diagnosis not present

## 2022-07-11 ENCOUNTER — Other Ambulatory Visit: Payer: Self-pay | Admitting: Family Medicine

## 2022-07-11 DIAGNOSIS — E1159 Type 2 diabetes mellitus with other circulatory complications: Secondary | ICD-10-CM

## 2022-07-11 MED ORDER — HYDROCHLOROTHIAZIDE 25 MG PO TABS: 25.0000 mg | ORAL_TABLET | Freq: Every day | ORAL | 1 refills | Status: AC

## 2022-07-11 NOTE — Telephone Encounter (Signed)
  Prescription Request  07/11/2022  Is this a "Controlled Substance" medicine?   Have you seen your PCP in the last 2 weeks? No, made appt to re est with Alvie Heidelberg   If YES, route message to pool  -  If NO, patient needs to be scheduled for appointment.  What is the name of the medication or equipment? hydrochlorothiazide (HYDRODIURIL) 25 MG tablet   Have you contacted your pharmacy to request a refill? yes  Which pharmacy would you like this sent to?  [facility], [place]     Patient notified that their request is being sent to the clinical staff for review and that they should receive a response within 2 business days.

## 2022-07-12 NOTE — Telephone Encounter (Signed)
LMOVM refill sent to pharmacy 

## 2022-07-19 ENCOUNTER — Other Ambulatory Visit: Payer: Self-pay | Admitting: *Deleted

## 2022-07-19 DIAGNOSIS — I152 Hypertension secondary to endocrine disorders: Secondary | ICD-10-CM

## 2022-07-19 MED ORDER — LOSARTAN POTASSIUM 25 MG PO TABS: 25.0000 mg | ORAL_TABLET | Freq: Every day | ORAL | 0 refills | Status: AC

## 2022-08-22 ENCOUNTER — Ambulatory Visit: Payer: Self-pay | Admitting: Family Medicine

## 2022-08-23 ENCOUNTER — Encounter: Payer: Self-pay | Admitting: Nurse Practitioner

## 2022-11-30 ENCOUNTER — Other Ambulatory Visit: Payer: Self-pay | Admitting: Family Medicine

## 2022-11-30 DIAGNOSIS — E1159 Type 2 diabetes mellitus with other circulatory complications: Secondary | ICD-10-CM

## 2022-12-01 ENCOUNTER — Encounter: Payer: Self-pay | Admitting: Nurse Practitioner

## 2022-12-01 NOTE — Telephone Encounter (Signed)
Je pt NTBS by new provider last OV 06/13/21 NO RF sent to pharmacy last OV greater than a year

## 2022-12-01 NOTE — Telephone Encounter (Signed)
NA. Letter mailed
